# Patient Record
Sex: Male | Born: 1965 | Race: White | Hispanic: No | Marital: Married | Smoking: Never smoker
Health system: Southern US, Community
[De-identification: ages and names within clinical notes are randomized; demographics above are authoritative.]

## PROBLEM LIST (undated history)

## (undated) DIAGNOSIS — I341 Nonrheumatic mitral (valve) prolapse: Secondary | ICD-10-CM

## (undated) DIAGNOSIS — J302 Other seasonal allergic rhinitis: Secondary | ICD-10-CM

## (undated) DIAGNOSIS — E785 Hyperlipidemia, unspecified: Secondary | ICD-10-CM

## (undated) DIAGNOSIS — I10 Essential (primary) hypertension: Secondary | ICD-10-CM

## (undated) DIAGNOSIS — T7840XA Allergy, unspecified, initial encounter: Secondary | ICD-10-CM

## (undated) HISTORY — DX: Hyperlipidemia, unspecified: E78.5

## (undated) HISTORY — DX: Allergy, unspecified, initial encounter: T78.40XA

## (undated) HISTORY — DX: Essential (primary) hypertension: I10

## (undated) HISTORY — DX: Other seasonal allergic rhinitis: J30.2

## (undated) HISTORY — PX: WISDOM TOOTH EXTRACTION: SHX21

## (undated) HISTORY — PX: SEPTOPLASTY: SUR1290

## (undated) HISTORY — DX: Nonrheumatic mitral (valve) prolapse: I34.1

---

## 2005-10-07 ENCOUNTER — Ambulatory Visit: Payer: Self-pay | Admitting: Sports Medicine

## 2005-11-11 ENCOUNTER — Ambulatory Visit: Payer: Self-pay | Admitting: Sports Medicine

## 2006-07-18 ENCOUNTER — Ambulatory Visit: Payer: Self-pay | Admitting: Family Medicine

## 2006-07-18 LAB — CONVERTED CEMR LAB
ALT: 29 units/L (ref 0–40)
Albumin: 4.1 g/dL (ref 3.5–5.2)
Alkaline Phosphatase: 82 units/L (ref 39–117)
BUN: 15 mg/dL (ref 6–23)
Basophils Absolute: 0 10*3/uL (ref 0.0–0.1)
Basophils Relative: 0.4 % (ref 0.0–1.0)
CO2: 29 meq/L (ref 19–32)
Calcium: 9.6 mg/dL (ref 8.4–10.5)
Chloride: 100 meq/L (ref 96–112)
Cholesterol: 220 mg/dL (ref 0–200)
Creatinine, Ser: 0.9 mg/dL (ref 0.4–1.5)
HDL: 37.9 mg/dL — ABNORMAL LOW (ref >39.0)
Hemoglobin: 16.5 g/dL (ref 13.0–17.0)
Monocytes Relative: 8.6 % (ref 3.0–11.0)
Platelets: 368 10*3/uL (ref 150–400)
RBC: 5.18 M/uL (ref 4.22–5.81)
RDW: 11.8 % (ref 11.5–14.6)
TSH: 0.92 microintl units/mL (ref 0.35–5.50)
Total Bilirubin: 1.2 mg/dL (ref 0.3–1.2)
Total CHOL/HDL Ratio: 5.8
Total Protein: 7 g/dL (ref 6.0–8.3)
Triglycerides: 514 mg/dL (ref 0–149)
VLDL: 103 mg/dL — ABNORMAL HIGH (ref 0–40)

## 2006-10-10 ENCOUNTER — Ambulatory Visit: Payer: Self-pay | Admitting: Family Medicine

## 2006-10-10 LAB — CONVERTED CEMR LAB
Albumin: 4.3 g/dL (ref 3.5–5.2)
Alkaline Phosphatase: 53 units/L (ref 39–117)
Direct LDL: 148.8 mg/dL
HDL: 30.5 mg/dL — ABNORMAL LOW (ref >39.0)
Total Bilirubin: 1.1 mg/dL (ref 0.3–1.2)
Total CHOL/HDL Ratio: 7.2
Total Protein: 7.1 g/dL (ref 6.0–8.3)
Triglycerides: 244 mg/dL (ref 0–149)

## 2007-01-03 ENCOUNTER — Ambulatory Visit: Payer: Self-pay | Admitting: Sports Medicine

## 2007-01-03 DIAGNOSIS — M25569 Pain in unspecified knee: Secondary | ICD-10-CM

## 2007-01-03 DIAGNOSIS — M109 Gout, unspecified: Secondary | ICD-10-CM

## 2007-01-05 ENCOUNTER — Ambulatory Visit: Payer: Self-pay | Admitting: Sports Medicine

## 2007-01-16 ENCOUNTER — Ambulatory Visit: Payer: Self-pay | Admitting: Sports Medicine

## 2007-03-06 ENCOUNTER — Ambulatory Visit: Payer: Self-pay | Admitting: Family Medicine

## 2007-03-06 DIAGNOSIS — I10 Essential (primary) hypertension: Secondary | ICD-10-CM | POA: Insufficient documentation

## 2007-03-06 DIAGNOSIS — E785 Hyperlipidemia, unspecified: Secondary | ICD-10-CM | POA: Insufficient documentation

## 2007-03-08 LAB — CONVERTED CEMR LAB
ALT: 38 units/L (ref 0–53)
Alkaline Phosphatase: 53 units/L (ref 39–117)
Bilirubin, Direct: 0.1 mg/dL (ref 0.0–0.3)
Cholesterol: 184 mg/dL (ref 0–200)
HDL: 29.1 mg/dL — ABNORMAL LOW (ref >39.0)
LDL Cholesterol: 115 mg/dL — ABNORMAL HIGH (ref 0–99)
Total Bilirubin: 0.9 mg/dL (ref 0.3–1.2)
Total Protein: 7.2 g/dL (ref 6.0–8.3)

## 2008-04-25 ENCOUNTER — Telehealth: Payer: Self-pay | Admitting: *Deleted

## 2008-09-11 ENCOUNTER — Ambulatory Visit: Payer: Self-pay | Admitting: Family Medicine

## 2008-09-11 DIAGNOSIS — B351 Tinea unguium: Secondary | ICD-10-CM

## 2008-09-19 ENCOUNTER — Encounter: Payer: Self-pay | Admitting: Family Medicine

## 2008-09-19 LAB — CONVERTED CEMR LAB
AST: 28 units/L (ref 0–37)
Albumin: 4.5 g/dL (ref 3.5–5.2)
BUN: 17 mg/dL (ref 6–23)
Basophils Absolute: 0 10*3/uL (ref 0.0–0.1)
CO2: 29 meq/L (ref 19–32)
Chloride: 104 meq/L (ref 96–112)
Eosinophils Absolute: 0.1 10*3/uL (ref 0.0–0.7)
Glucose, Bld: 95 mg/dL (ref 70–99)
HCT: 44.5 % (ref 39.0–52.0)
HDL: 32.4 mg/dL — ABNORMAL LOW (ref >39.00)
Hemoglobin: 15.8 g/dL (ref 13.0–17.0)
Lymphs Abs: 2.7 10*3/uL (ref 0.7–4.0)
MCHC: 35.5 g/dL (ref 30.0–36.0)
MCV: 90.4 fL (ref 78.0–100.0)
Monocytes Absolute: 0.5 10*3/uL (ref 0.1–1.0)
Monocytes Relative: 7.8 % (ref 3.0–12.0)
Neutro Abs: 3 10*3/uL (ref 1.4–7.7)
Platelets: 353 10*3/uL (ref 150.0–400.0)
Potassium: 3.8 meq/L (ref 3.5–5.1)
RDW: 11.5 % (ref 11.5–14.6)
Sodium: 139 meq/L (ref 135–145)
TSH: 0.97 microintl units/mL (ref 0.35–5.50)
Total Bilirubin: 1.1 mg/dL (ref 0.3–1.2)
Uric Acid, Serum: 6.4 mg/dL (ref 4.0–7.8)

## 2008-09-24 ENCOUNTER — Telehealth: Payer: Self-pay | Admitting: Family Medicine

## 2008-11-11 ENCOUNTER — Ambulatory Visit: Payer: Self-pay | Admitting: Family Medicine

## 2008-11-11 DIAGNOSIS — L989 Disorder of the skin and subcutaneous tissue, unspecified: Secondary | ICD-10-CM | POA: Insufficient documentation

## 2008-11-11 DIAGNOSIS — L909 Atrophic disorder of skin, unspecified: Secondary | ICD-10-CM | POA: Insufficient documentation

## 2008-11-11 DIAGNOSIS — L919 Hypertrophic disorder of the skin, unspecified: Secondary | ICD-10-CM

## 2009-02-17 ENCOUNTER — Telehealth: Payer: Self-pay | Admitting: Family Medicine

## 2010-02-04 ENCOUNTER — Ambulatory Visit: Payer: Self-pay | Admitting: Family Medicine

## 2010-02-04 LAB — CONVERTED CEMR LAB
Bilirubin Urine: NEGATIVE
Glucose, Urine, Semiquant: NEGATIVE
Protein, U semiquant: NEGATIVE
Urobilinogen, UA: 0.2
WBC Urine, dipstick: NEGATIVE
pH: 7

## 2010-02-05 LAB — CONVERTED CEMR LAB
AST: 33 units/L (ref 0–37)
Alkaline Phosphatase: 41 units/L (ref 39–117)
BUN: 17 mg/dL (ref 6–23)
Basophils Absolute: 0 10*3/uL (ref 0.0–0.1)
Bilirubin, Direct: 0.1 mg/dL (ref 0.0–0.3)
Calcium: 9.8 mg/dL (ref 8.4–10.5)
Creatinine, Ser: 1.2 mg/dL (ref 0.4–1.5)
Direct LDL: 85.1 mg/dL
Eosinophils Absolute: 0.2 10*3/uL (ref 0.0–0.7)
GFR calc non Af Amer: 70.74 mL/min (ref >60)
Glucose, Bld: 89 mg/dL (ref 70–99)
Hemoglobin: 14.5 g/dL (ref 13.0–17.0)
Lymphocytes Relative: 43.9 % (ref 12.0–46.0)
Monocytes Relative: 6.7 % (ref 3.0–12.0)
Neutrophils Relative %: 46.3 % (ref 43.0–77.0)
Platelets: 388 10*3/uL (ref 150.0–400.0)
RDW: 12.2 % (ref 11.5–14.6)
Sodium: 141 meq/L (ref 135–145)
Total Bilirubin: 0.6 mg/dL (ref 0.3–1.2)

## 2010-02-10 ENCOUNTER — Ambulatory Visit: Payer: Self-pay | Admitting: Family Medicine

## 2010-05-18 NOTE — Assessment & Plan Note (Signed)
Summary: CPX/CJR   Vital Signs:  Patient profile:   45 year old male Height:      70 inches Weight:      221 pounds BMI:     31.82 O2 Sat:      97 % Temp:     98.5 degrees F Pulse rate:   103 / minute BP sitting:   126 / 72  (left arm) Cuff size:   large  Vitals Entered By: Pura Spice, RN (February 10, 2010 1:40 PM) CC: cpx   History of Present Illness: 45 yr old male for a cpx. He feels good and has no concerns. He is watching his diet and exercising. Since adding Fenofibrate to his meds, his TG have dropped by one half.   Preventive Screening-Counseling & Management  Alcohol-Tobacco     Smoking Status: never  Allergies (verified): No Known Drug Allergies  Past History:  Past Medical History: mitral valve prolapse Gout Hyperlipidemia Hypertension  Past Surgical History: nasal septoplasty  Family History: Reviewed history and no changes required. Family History Breast cancer 1st degree relative <50 Family History Diabetes 1st degree relative Family History High cholesterol  Social History: Reviewed history and no changes required. Occupation: Art gallery manager Married Never Smoked Alcohol use-yes Occupation:  employed Smoking Status:  never  Review of Systems  The patient denies anorexia, fever, weight loss, weight gain, vision loss, decreased hearing, hoarseness, chest pain, syncope, dyspnea on exertion, peripheral edema, prolonged cough, headaches, hemoptysis, abdominal pain, melena, hematochezia, severe indigestion/heartburn, hematuria, incontinence, genital sores, muscle weakness, suspicious skin lesions, transient blindness, difficulty walking, depression, unusual weight change, abnormal bleeding, enlarged lymph nodes, angioedema, breast masses, and testicular masses.         Flu Vaccine Consent Questions     Do you have a history of severe allergic reactions to this vaccine? no    Any prior history of allergic reactions to egg and/or gelatin? no    Do you  have a sensitivity to the preservative Thimersol? no    Do you have a past history of Guillan-Barre Syndrome? no    Do you currently have an acute febrile illness? no    Have you ever had a severe reaction to latex? no    Vaccine information given and explained to patient? yes    Are you currently pregnant? no    Lot Number:[id number]   Exp Date:10/16/2010   Site Given  Left Deltoid IM Pura Spice, RN  February 10, 2010 1:41 PM   Physical Exam  General:  overweight-appearing.   Head:  Normocephalic and atraumatic without obvious abnormalities. No apparent alopecia or balding. Eyes:  No corneal or conjunctival inflammation noted. EOMI. Perrla. Funduscopic exam benign, without hemorrhages, exudates or papilledema. Vision grossly normal. Ears:  External ear exam shows no significant lesions or deformities.  Otoscopic examination reveals clear canals, tympanic membranes are intact bilaterally without bulging, retraction, inflammation or discharge. Hearing is grossly normal bilaterally. Nose:  External nasal examination shows no deformity or inflammation. Nasal mucosa are pink and moist without lesions or exudates. Mouth:  Oral mucosa and oropharynx without lesions or exudates.  Teeth in good repair. Neck:  No deformities, masses, or tenderness noted. Chest Wall:  No deformities, masses, tenderness or gynecomastia noted. Lungs:  Normal respiratory effort, chest expands symmetrically. Lungs are clear to auscultation, no crackles or wheezes. Heart:  Normal rate and regular rhythm. S1 and S2 normal without gallop, murmur, click, rub or other extra sounds. Abdomen:  Bowel sounds positive,abdomen  soft and non-tender without masses, organomegaly or hernias noted. Genitalia:  Testes bilaterally descended without nodularity, tenderness or masses. No scrotal masses or lesions. No penis lesions or urethral discharge. Msk:  No deformity or scoliosis noted of thoracic or lumbar spine.   Pulses:  R and L  carotid,radial,femoral,dorsalis pedis and posterior tibial pulses are full and equal bilaterally Extremities:  No clubbing, cyanosis, edema, or deformity noted with normal full range of motion of all joints.   Neurologic:  No cranial nerve deficits noted. Station and gait are normal. Plantar reflexes are down-going bilaterally. DTRs are symmetrical throughout. Sensory, motor and coordinative functions appear intact. Skin:  Intact without suspicious lesions or rashes Cervical Nodes:  No lymphadenopathy noted Axillary Nodes:  No palpable lymphadenopathy Inguinal Nodes:  No significant adenopathy Psych:  Cognition and judgment appear intact. Alert and cooperative with normal attention span and concentration. No apparent delusions, illusions, hallucinations   Impression & Recommendations:  Problem # 1:  HEALTH MAINTENANCE EXAM (ICD-V70.0)  Complete Medication List: 1)  Allopurinol 100 Mg Tabs (Allopurinol) .Marland Kitchen.. 1 by mouth once daily 2)  Cozaar 100 Mg Tabs (Losartan potassium) .... Take 1 tablet by mouth once a day 3)  Crestor 20 Mg Tabs (Rosuvastatin calcium) .... Once daily 4)  Tricor 145 Mg Tabs (Fenofibrate) .... Take 1 tablet by mouth every night 5)  Fish Oil 1000 Mg Caps (Omega-3 fatty acids) .... 2 once daily  Other Orders: Admin 1st Vaccine (16109) Flu Vaccine 69yrs + (60454) Tdap => 73yrs IM (09811) Admin of Any Addtl Vaccine (91478)  Patient Instructions: 1)  Please schedule a follow-up appointment in 6 months .  Prescriptions: TRICOR 145 MG TABS (FENOFIBRATE) Take 1 tablet by mouth every night  #90 x 3   Entered and Authorized by:   Nelwyn Salisbury MD   Signed by:   Nelwyn Salisbury MD on 02/10/2010   Method used:   Print then Give to Patient   RxID:   [id number] CRESTOR 20 MG TABS (ROSUVASTATIN CALCIUM) once daily  #90 x 3   Entered and Authorized by:   Nelwyn Salisbury MD   Signed by:   Nelwyn Salisbury MD on 02/10/2010   Method used:   Print then Give to Patient   RxID:    [id number] COZAAR 100 MG TABS (LOSARTAN POTASSIUM) Take 1 tablet by mouth once a day  #90 x 3   Entered and Authorized by:   Nelwyn Salisbury MD   Signed by:   Nelwyn Salisbury MD on 02/10/2010   Method used:   Print then Give to Patient   RxID:   [id number] ALLOPURINOL 100 MG TABS (ALLOPURINOL) 1 by mouth once daily  #90 x 3   Entered and Authorized by:   Nelwyn Salisbury MD   Signed by:   Nelwyn Salisbury MD on 02/10/2010   Method used:   Print then Give to Patient   RxID:   [id number]    Orders Added: 1)  Admin 1st Vaccine [90471] 2)  Flu Vaccine 86yrs + [41660] 3)  Tdap => 82yrs IM [63016] 4)  Admin of Any Addtl Vaccine [90472] 5)  Est. Patient 40-64 years [01093]   Immunizations Administered:  Tetanus Vaccine:    Vaccine Type: Tdap    Site: right deltoid    Mfr: GlaxoSmithKline    Dose: 0.5 ml    Route: IM    Given by: Pura Spice, RN    Exp. Date: 02/05/2012  Lot #: [id number]    VIS given: 03/05/08 version given February 10, 2010.   Immunizations Administered:  Tetanus Vaccine:    Vaccine Type: Tdap    Site: right deltoid    Mfr: GlaxoSmithKline    Dose: 0.5 ml    Route: IM    Given by: Pura Spice, RN    Exp. Date: 02/05/2012    Lot #: [id number]    VIS given: 03/05/08 version given February 10, 2010.

## 2010-06-15 ENCOUNTER — Other Ambulatory Visit: Payer: Self-pay | Admitting: Family Medicine

## 2010-09-03 NOTE — Assessment & Plan Note (Signed)
[facility] OFFICE NOTE   NAME:Austin Hughes, Austin Hughes                         MRN:          [id number]  DATE:07/18/2006                            DOB:          18-Apr-1966    This is a 45 year old gentleman here to establish care with the  practice, and who is requesting medication refills.  He has a long  history of hypertension, and high cholesterol, high triglycerides.  Unfortunately he ran out of all of his medications over a year ago and  really has not seen a doctor in that time.  In general he feels fine.  He does mention an occasional gout flare-up.  This used to be much more  common, but now occurs only about once a year.   PAST MEDICAL HISTORY:  He had been seeing a doctor in [place] up  until about a year and a half ago before moving to [place].  He was  diagnosed with high cholesterol and hypertension about 5 years ago and  has been on medications as well as a strict diet ever since.  His only  surgical history is repair of a deviated septum.  He was diagnosed with  mild mitral valve prolapse about 7 years ago.  Cardiologist at that time  worked him up with a Holter monitor and an echocardiogram.  Apparently  he is asymptomatic from it.   ALLERGIES:  NONE.   CURRENT MEDICATIONS:  None (however he had previously been on Cozaar 100  mg per day, Advicor 500/20 once a day, and Tricor 145 mg per day).   HABITS:  He does not use tobacco, he drinks some alcohol.   SOCIAL HISTORY:  He is married with 2 children.  He is an Art gallery manager.   FAMILY HISTORY:  Remarkable for breast cancer, high cholesterol, and  diabetes.   OBJECTIVE:  Height 5 foot 11 inches, weight 221, blood pressure 146/82,  pulse 76 and regular.  GENERAL:  He appears to be fairly healthy.  NECK:  Supple without lymphadenopathy or masses.  LUNGS:  Clear.  CARDIAC:  Rate and rhythm regular without gallops, murmurs, or rubs.  Distal pulses  are full.   ASSESSMENT/PLAN:  1. Hyperlipidemia.  Will restart Tricor 145 mg per day and instead of      Advicor will begin Crestor 10 mg once daily.  He is fasting today      so I will check a fasting lipid panel while he is here.  2. Hypertension.  Will restart Cozaar 100 mg once daily.  3. History of gout.  Will check a uric acid level today.    Tera Mater. Clent Ridges, MD  Electronically Signed   SAF/MedQ  DD: 07/18/2006  DT: 07/18/2006  Job #: [id number]

## 2010-09-14 ENCOUNTER — Other Ambulatory Visit: Payer: Self-pay | Admitting: Family Medicine

## 2010-09-15 ENCOUNTER — Encounter: Payer: Self-pay | Admitting: Family Medicine

## 2010-09-15 ENCOUNTER — Ambulatory Visit (INDEPENDENT_AMBULATORY_CARE_PROVIDER_SITE_OTHER): Payer: 59 | Admitting: Family Medicine

## 2010-09-15 DIAGNOSIS — L600 Ingrowing nail: Secondary | ICD-10-CM

## 2010-09-15 DIAGNOSIS — I10 Essential (primary) hypertension: Secondary | ICD-10-CM

## 2010-09-15 DIAGNOSIS — M109 Gout, unspecified: Secondary | ICD-10-CM

## 2010-09-15 DIAGNOSIS — E785 Hyperlipidemia, unspecified: Secondary | ICD-10-CM

## 2010-09-15 MED ORDER — ASPIRIN EC 81 MG PO TBEC: 81.0000 mg | DELAYED_RELEASE_TABLET | Freq: Every day | ORAL | Status: AC

## 2010-09-15 MED ORDER — ALLOPURINOL 100 MG PO TABS: 100.0000 mg | ORAL_TABLET | Freq: Every day | ORAL | Status: DC

## 2010-09-15 NOTE — Progress Notes (Signed)
  Subjective:    Patient ID: Austin Hughes, male    DOB: April 24, 1965, 45 y.o.   MRN: [id number]  HPI Here for refills and to check on a painful right great toe. This has been bothering him for about 2 months. His BP is stable and he has been doing well. No gout flares for several years now.    Review of Systems  Constitutional: Negative.   Respiratory: Negative.   Cardiovascular: Negative.        Objective:   Physical Exam  Constitutional: He appears well-developed and well-nourished.  Cardiovascular: Normal rate, regular rhythm, normal heart sounds and intact distal pulses.   Pulmonary/Chest: Effort normal and breath sounds normal.  Skin:       The right great toenail is ingrown both medially and laterally. The toe is tender but not red. The nail is cut extremely short          Assessment & Plan:  Advised him to keep his toenails longer to avoid getting ingrown. Try hot soaks with Epsom salts. Set up fasting labs soon.

## 2010-09-21 ENCOUNTER — Other Ambulatory Visit: Payer: Self-pay | Admitting: Family Medicine

## 2010-09-21 ENCOUNTER — Other Ambulatory Visit (INDEPENDENT_AMBULATORY_CARE_PROVIDER_SITE_OTHER): Payer: 59

## 2010-09-21 DIAGNOSIS — E785 Hyperlipidemia, unspecified: Secondary | ICD-10-CM

## 2010-09-21 LAB — LIPID PANEL
Cholesterol: 165 mg/dL (ref 0–200)
HDL: 37.8 mg/dL — ABNORMAL LOW (ref >39.00)
Total CHOL/HDL Ratio: 4
VLDL: 47.4 mg/dL — ABNORMAL HIGH (ref 0.0–40.0)

## 2010-09-21 LAB — HEPATIC FUNCTION PANEL
Albumin: 4.3 g/dL (ref 3.5–5.2)
Alkaline Phosphatase: 49 U/L (ref 39–117)
Total Bilirubin: 0.5 mg/dL (ref 0.3–1.2)

## 2010-09-27 ENCOUNTER — Encounter: Payer: Self-pay | Admitting: Family Medicine

## 2010-09-27 ENCOUNTER — Telehealth: Payer: Self-pay | Admitting: *Deleted

## 2010-09-27 NOTE — Telephone Encounter (Signed)
Pt wanted lab results. Pt aware of results.

## 2011-02-08 ENCOUNTER — Other Ambulatory Visit: Payer: Self-pay | Admitting: Family Medicine

## 2011-02-08 NOTE — Telephone Encounter (Signed)
Scripts sent e-scribe 

## 2011-07-04 ENCOUNTER — Other Ambulatory Visit: Payer: Self-pay | Admitting: Family Medicine

## 2011-10-13 ENCOUNTER — Other Ambulatory Visit: Payer: Self-pay | Admitting: Family Medicine

## 2011-10-13 NOTE — Telephone Encounter (Signed)
Can we refill this? 

## 2011-10-14 NOTE — Telephone Encounter (Signed)
I called and left a voice message. I need the name and location of a local pharmacy. The script was only approved for a 30 day supply and pt does not have a local pharmacy listed. He needs to call back with this information.

## 2011-10-14 NOTE — Telephone Encounter (Signed)
Call in #30 only. He needs an OV  

## 2011-10-17 ENCOUNTER — Telehealth: Payer: Self-pay | Admitting: Family Medicine

## 2011-10-17 MED ORDER — ALLOPURINOL 100 MG PO TABS: 100.0000 mg | ORAL_TABLET | Freq: Every day | ORAL | Status: DC

## 2011-10-17 NOTE — Telephone Encounter (Signed)
I sent script for Allopurinol to [facility] for a 30 day supply. Pt needs office visit for future refills.

## 2011-10-17 NOTE — Telephone Encounter (Signed)
I did get pt's local pharmacy and sent script e-scribe.

## 2011-12-13 ENCOUNTER — Other Ambulatory Visit (INDEPENDENT_AMBULATORY_CARE_PROVIDER_SITE_OTHER): Payer: 59

## 2011-12-13 DIAGNOSIS — Z0000 Encounter for general adult medical examination without abnormal findings: Secondary | ICD-10-CM

## 2011-12-13 LAB — CBC WITH DIFFERENTIAL/PLATELET
Basophils Absolute: 0 10*3/uL (ref 0.0–0.1)
Eosinophils Absolute: 0.2 10*3/uL (ref 0.0–0.7)
Lymphocytes Relative: 45.1 % (ref 12.0–46.0)
MCHC: 33.8 g/dL (ref 30.0–36.0)
Monocytes Relative: 8.3 % (ref 3.0–12.0)
Platelets: 342 10*3/uL (ref 150.0–400.0)
RDW: 12.3 % (ref 11.5–14.6)

## 2011-12-13 LAB — POCT URINALYSIS DIPSTICK
Bilirubin, UA: NEGATIVE
Blood, UA: NEGATIVE
Glucose, UA: NEGATIVE
Nitrite, UA: NEGATIVE
Spec Grav, UA: 1.02
Urobilinogen, UA: 0.2

## 2011-12-13 LAB — BASIC METABOLIC PANEL
BUN: 24 mg/dL — ABNORMAL HIGH (ref 6–23)
CO2: 24 mEq/L (ref 19–32)
Calcium: 9.3 mg/dL (ref 8.4–10.5)
Creatinine, Ser: 1.3 mg/dL (ref 0.4–1.5)
GFR: 64.48 mL/min (ref >60.00)
Glucose, Bld: 104 mg/dL — ABNORMAL HIGH (ref 70–99)

## 2011-12-13 LAB — LIPID PANEL
Cholesterol: 152 mg/dL (ref 0–200)
HDL: 36 mg/dL — ABNORMAL LOW (ref >39.00)
Total CHOL/HDL Ratio: 4
Triglycerides: 174 mg/dL — ABNORMAL HIGH (ref 0.0–149.0)

## 2011-12-13 LAB — HEPATIC FUNCTION PANEL
AST: 30 U/L (ref 0–37)
Alkaline Phosphatase: 48 U/L (ref 39–117)
Total Bilirubin: 0.6 mg/dL (ref 0.3–1.2)

## 2011-12-13 LAB — TSH: TSH: 1.34 u[IU]/mL (ref 0.35–5.50)

## 2011-12-20 ENCOUNTER — Ambulatory Visit (INDEPENDENT_AMBULATORY_CARE_PROVIDER_SITE_OTHER): Payer: 59 | Admitting: Family Medicine

## 2011-12-20 ENCOUNTER — Encounter: Payer: Self-pay | Admitting: Family Medicine

## 2011-12-20 VITALS — BP 120/80 | HR 68 | Temp 98.6°F | Ht 72.0 in | Wt 221.0 lb

## 2011-12-20 DIAGNOSIS — Z0000 Encounter for general adult medical examination without abnormal findings: Secondary | ICD-10-CM

## 2011-12-20 MED ORDER — ROSUVASTATIN CALCIUM 20 MG PO TABS: 20.0000 mg | ORAL_TABLET | Freq: Every day | ORAL | Status: DC

## 2011-12-20 MED ORDER — LOSARTAN POTASSIUM 100 MG PO TABS: 100.0000 mg | ORAL_TABLET | Freq: Every day | ORAL | Status: DC

## 2011-12-20 MED ORDER — FENOFIBRATE 145 MG PO TABS: 145.0000 mg | ORAL_TABLET | Freq: Every day | ORAL | Status: DC

## 2011-12-20 MED ORDER — ALLOPURINOL 100 MG PO TABS: 100.0000 mg | ORAL_TABLET | Freq: Every day | ORAL | Status: DC

## 2011-12-20 NOTE — Progress Notes (Signed)
  Subjective:    Patient ID: Austin Hughes, male    DOB: 1965/12/23, 46 y.o.   MRN: [id number]  HPI 46 yr old male for a cpx. He feels fine and has no concerns.    Review of Systems  Constitutional: Negative.   HENT: Negative.   Eyes: Negative.   Respiratory: Negative.   Cardiovascular: Negative.   Gastrointestinal: Negative.   Genitourinary: Negative.   Musculoskeletal: Negative.   Skin: Negative.   Neurological: Negative.   Hematological: Negative.   Psychiatric/Behavioral: Negative.        Objective:   Physical Exam  Constitutional: He is oriented to person, place, and time. He appears well-developed and well-nourished. No distress.  HENT:  Head: Normocephalic and atraumatic.  Right Ear: External ear normal.  Left Ear: External ear normal.  Nose: Nose normal.  Mouth/Throat: Oropharynx is clear and moist. No oropharyngeal exudate.  Eyes: Conjunctivae and EOM are normal. Pupils are equal, round, and reactive to light. Right eye exhibits no discharge. Left eye exhibits no discharge. No scleral icterus.  Neck: Neck supple. No JVD present. No tracheal deviation present. No thyromegaly present.  Cardiovascular: Normal rate, regular rhythm, normal heart sounds and intact distal pulses.  Exam reveals no gallop and no friction rub.   No murmur heard. Pulmonary/Chest: Effort normal and breath sounds normal. No respiratory distress. He has no wheezes. He has no rales. He exhibits no tenderness.  Abdominal: Soft. Bowel sounds are normal. He exhibits no distension and no mass. There is no tenderness. There is no rebound and no guarding.  Genitourinary: Rectum normal, prostate normal and penis normal. Guaiac negative stool. No penile tenderness.  Musculoskeletal: Normal range of motion. He exhibits no edema and no tenderness.  Lymphadenopathy:    He has no cervical adenopathy.  Neurological: He is alert and oriented to person, place, and time. He has normal reflexes. No cranial nerve  deficit. He exhibits normal muscle tone. Coordination normal.  Skin: Skin is warm and dry. No rash noted. He is not diaphoretic. No erythema. No pallor.  Psychiatric: He has a normal mood and affect. His behavior is normal. Judgment and thought content normal.          Assessment & Plan:  Well exam.

## 2012-06-05 ENCOUNTER — Ambulatory Visit (INDEPENDENT_AMBULATORY_CARE_PROVIDER_SITE_OTHER): Payer: 59 | Admitting: Family Medicine

## 2012-06-05 ENCOUNTER — Encounter: Payer: Self-pay | Admitting: Family Medicine

## 2012-06-05 ENCOUNTER — Telehealth: Payer: Self-pay | Admitting: Family Medicine

## 2012-06-05 VITALS — BP 126/74 | HR 90 | Temp 99.0°F | Wt 226.0 lb

## 2012-06-05 DIAGNOSIS — N390 Urinary tract infection, site not specified: Secondary | ICD-10-CM

## 2012-06-05 LAB — POCT URINALYSIS DIPSTICK
Bilirubin, UA: NEGATIVE
Glucose, UA: NEGATIVE
Ketones, UA: NEGATIVE
Nitrite, UA: NEGATIVE
Protein, UA: NEGATIVE
Spec Grav, UA: <=1.005
Urobilinogen, UA: 0.2
pH, UA: 7.5

## 2012-06-05 MED ORDER — CIPROFLOXACIN HCL 500 MG PO TABS: 500.0000 mg | ORAL_TABLET | Freq: Two times a day (BID) | ORAL | Status: DC

## 2012-06-05 NOTE — Progress Notes (Signed)
  Subjective:    Patient ID: Austin Hughes, male    DOB: [birth year], 47 y.o.   MRN: [id number]  HPI Here for 2 days of intermittent brownish red coloration to the urine. He had some mild lower back pain yesterday but not today. No burning or urgency or fever. No recent trauma.    Review of Systems  Constitutional: Negative.   Gastrointestinal: Negative.   Genitourinary: Positive for hematuria. Negative for dysuria, frequency, flank pain, difficulty urinating and testicular pain.       Objective:   Physical Exam  Constitutional: He appears well-developed and well-nourished.  Abdominal: Soft. Bowel sounds are normal. He exhibits no distension and no mass. There is no tenderness. There is no rebound and no guarding.          Assessment & Plan:  Culture the urine. Start on Cipro. Drink plenty of water

## 2012-06-05 NOTE — Telephone Encounter (Signed)
Patient Information:  Caller Name: Jeptha  Phone: [phone]  Patient: Austin Hughes, Austin Hughes  Gender: Male  DOB: 10-03-1965  Age: 47 Years  PCP: Gershon Crane [facility])  Office Follow Up:  Does the office need to follow up with this patient?: No  Instructions For The Office: N/A  RN Note:  Per [facility] See Provider w/i 72 hrs d/t "Change in color of urine not related to consuming food or dyes and not previously evaluated' per [facility] Urine Protocol  Symptoms  Reason For Call & Symptoms: Blood in urine.  Afebrile.  Had mild low back discomfort 06/04/12. No pain today.  No dysuria.  Reviewed Health History In EMR: Yes  Reviewed Medications In EMR: Yes  Reviewed Allergies In EMR: Yes  Reviewed Surgeries / Procedures: Yes  Date of Onset of Symptoms: 06/04/2012  Guideline(s) Used:  No Protocol Available - Sick Adult  Disposition Per Guideline:   See Today or Tomorrow in Office  Reason For Disposition Reached:   Nursing judgment  Advice Given:  Call Back If:  New symptoms develop  You become worse.  Appointment Scheduled:  06/05/2012 16:00:00 Appointment Scheduled Provider:  Gershon Crane [facility])

## 2012-06-07 LAB — URINE CULTURE: Colony Count: NO GROWTH

## 2012-06-12 NOTE — Progress Notes (Signed)
Quick Note:    Results released to mychart.

## 2013-01-07 ENCOUNTER — Other Ambulatory Visit: Payer: Self-pay | Admitting: Family Medicine

## 2013-01-30 ENCOUNTER — Other Ambulatory Visit (INDEPENDENT_AMBULATORY_CARE_PROVIDER_SITE_OTHER): Payer: 59

## 2013-01-30 DIAGNOSIS — Z0000 Encounter for general adult medical examination without abnormal findings: Secondary | ICD-10-CM

## 2013-01-30 LAB — CBC WITH DIFFERENTIAL/PLATELET
Basophils Absolute: 0 10*3/uL (ref 0.0–0.1)
Eosinophils Absolute: 0.2 10*3/uL (ref 0.0–0.7)
HCT: 43.1 % (ref 39.0–52.0)
Lymphs Abs: 2.7 10*3/uL (ref 0.7–4.0)
MCHC: 34.4 g/dL (ref 30.0–36.0)
Monocytes Absolute: 0.5 10*3/uL (ref 0.1–1.0)
Monocytes Relative: 8 % (ref 3.0–12.0)
Platelets: 423 10*3/uL — ABNORMAL HIGH (ref 150.0–400.0)
RDW: 12 % (ref 11.5–14.6)

## 2013-01-30 LAB — LDL CHOLESTEROL, DIRECT: Direct LDL: 82.2 mg/dL

## 2013-01-30 LAB — HEPATIC FUNCTION PANEL
Albumin: 4.5 g/dL (ref 3.5–5.2)
Total Bilirubin: 0.8 mg/dL (ref 0.3–1.2)
Total Protein: 7.4 g/dL (ref 6.0–8.3)

## 2013-01-30 LAB — POCT URINALYSIS DIPSTICK
Glucose, UA: NEGATIVE
Ketones, UA: NEGATIVE
Leukocytes, UA: NEGATIVE
Nitrite, UA: NEGATIVE
Protein, UA: NEGATIVE
Urobilinogen, UA: 0.2
pH, UA: 7

## 2013-01-30 LAB — TSH: TSH: 1.15 u[IU]/mL (ref 0.35–5.50)

## 2013-01-30 LAB — BASIC METABOLIC PANEL
BUN: 18 mg/dL (ref 6–23)
CO2: 28 mEq/L (ref 19–32)
GFR: 69.12 mL/min (ref >60.00)
Glucose, Bld: 110 mg/dL — ABNORMAL HIGH (ref 70–99)
Potassium: 4.3 mEq/L (ref 3.5–5.1)

## 2013-01-30 LAB — LIPID PANEL
HDL: 32.7 mg/dL — ABNORMAL LOW (ref >39.00)
Total CHOL/HDL Ratio: 4

## 2013-02-05 NOTE — Progress Notes (Signed)
Quick Note:  I released results in my chart. ______ 

## 2013-02-06 ENCOUNTER — Ambulatory Visit (INDEPENDENT_AMBULATORY_CARE_PROVIDER_SITE_OTHER): Payer: 59 | Admitting: Family Medicine

## 2013-02-06 ENCOUNTER — Encounter: Payer: Self-pay | Admitting: Family Medicine

## 2013-02-06 VITALS — BP 120/62 | HR 109 | Temp 98.8°F | Ht 70.75 in | Wt 222.0 lb

## 2013-02-06 DIAGNOSIS — Z23 Encounter for immunization: Secondary | ICD-10-CM

## 2013-02-06 DIAGNOSIS — Z0000 Encounter for general adult medical examination without abnormal findings: Secondary | ICD-10-CM

## 2013-02-06 DIAGNOSIS — N2889 Other specified disorders of kidney and ureter: Secondary | ICD-10-CM

## 2013-02-06 DIAGNOSIS — N200 Calculus of kidney: Secondary | ICD-10-CM

## 2013-02-06 NOTE — Progress Notes (Signed)
  Subjective:    Patient ID: Austin Hughes, male    DOB: 10-Dec-1965, 47 y.o.   MRN: [id number]  HPI 47 yr old male for a cpx. He feels fine. He was here in February with a UTI. This resolved with treatment but he thinks he passed a small kidney stone several weeks after that. He had a mild pain in the right flank that moved to the lower abdomen over 2 or 3 hours and then as he was urinating he felt a tingle in the urethra and passed a tiny object into the toilet. He has never had this before or since.    Review of Systems  Constitutional: Negative.   HENT: Negative.   Eyes: Negative.   Respiratory: Negative.   Cardiovascular: Negative.   Gastrointestinal: Negative.   Genitourinary: Negative.   Musculoskeletal: Negative.   Skin: Negative.   Neurological: Negative.   Psychiatric/Behavioral: Negative.        Objective:   Physical Exam  Constitutional: He is oriented to person, place, and time. He appears well-developed and well-nourished. No distress.  HENT:  Head: Normocephalic and atraumatic.  Right Ear: External ear normal.  Left Ear: External ear normal.  Nose: Nose normal.  Mouth/Throat: Oropharynx is clear and moist. No oropharyngeal exudate.  Eyes: Conjunctivae and EOM are normal. Pupils are equal, round, and reactive to light. Right eye exhibits no discharge. Left eye exhibits no discharge. No scleral icterus.  Neck: Neck supple. No JVD present. No tracheal deviation present. No thyromegaly present.  Cardiovascular: Normal rate, regular rhythm, normal heart sounds and intact distal pulses.  Exam reveals no gallop and no friction rub.   No murmur heard. Pulmonary/Chest: Effort normal and breath sounds normal. No respiratory distress. He has no wheezes. He has no rales. He exhibits no tenderness.  Abdominal: Soft. Bowel sounds are normal. He exhibits no distension and no mass. There is no tenderness. There is no rebound and no guarding.  Genitourinary: Rectum normal, prostate  normal and penis normal. Guaiac negative stool. No penile tenderness.  Musculoskeletal: Normal range of motion. He exhibits no edema and no tenderness.  Lymphadenopathy:    He has no cervical adenopathy.  Neurological: He is alert and oriented to person, place, and time. He has normal reflexes. No cranial nerve deficit. He exhibits normal muscle tone. Coordination normal.  Skin: Skin is warm and dry. No rash noted. He is not diaphoretic. No erythema. No pallor.  Psychiatric: He has a normal mood and affect. His behavior is normal. Judgment and thought content normal.          Assessment & Plan:  Well exam. It does sound like he passed a kidney stone so we will set up a renal US to see there are any more present.

## 2013-02-25 ENCOUNTER — Other Ambulatory Visit: Payer: 59

## 2013-03-05 ENCOUNTER — Ambulatory Visit
Admission: RE | Admit: 2013-03-05 | Discharge: 2013-03-05 | Disposition: A | Discharge status: Home / Self Care | Payer: 59 | Source: Ambulatory Visit | Attending: Family Medicine | Admitting: Family Medicine

## 2013-03-05 DIAGNOSIS — N200 Calculus of kidney: Secondary | ICD-10-CM

## 2013-03-05 NOTE — Addendum Note (Signed)
Addended by: Gershon Crane A on: 03/05/2013 10:54 AM   Modules accepted: Orders

## 2013-03-12 ENCOUNTER — Ambulatory Visit
Admission: RE | Admit: 2013-03-12 | Discharge: 2013-03-12 | Disposition: A | Discharge status: Home / Self Care | Payer: 59 | Source: Ambulatory Visit | Attending: Family Medicine | Admitting: Family Medicine

## 2013-03-12 DIAGNOSIS — N2889 Other specified disorders of kidney and ureter: Secondary | ICD-10-CM

## 2013-03-12 MED ORDER — GADOBENATE DIMEGLUMINE 529 MG/ML IV SOLN
20.0000 mL | Freq: Once | INTRAVENOUS | Status: AC | PRN
  Administered 2013-03-12: 20 mL via INTRAVENOUS

## 2013-06-19 ENCOUNTER — Other Ambulatory Visit: Payer: Self-pay | Admitting: Family Medicine

## 2014-04-24 ENCOUNTER — Other Ambulatory Visit (INDEPENDENT_AMBULATORY_CARE_PROVIDER_SITE_OTHER): Payer: 59

## 2014-04-24 DIAGNOSIS — Z0000 Encounter for general adult medical examination without abnormal findings: Secondary | ICD-10-CM

## 2014-04-24 LAB — LIPID PANEL
CHOLESTEROL: 133 mg/dL (ref 0–200)
HDL: 32 mg/dL — ABNORMAL LOW (ref >39.00)
LDL CALC: 70 mg/dL (ref 0–99)
NonHDL: 101
TRIGLYCERIDES: 157 mg/dL — AB (ref 0.0–149.0)
Total CHOL/HDL Ratio: 4
VLDL: 31.4 mg/dL (ref 0.0–40.0)

## 2014-04-24 LAB — HEPATIC FUNCTION PANEL
ALK PHOS: 44 U/L (ref 39–117)
ALT: 31 U/L (ref 0–53)
AST: 36 U/L (ref 0–37)
Albumin: 4.6 g/dL (ref 3.5–5.2)
BILIRUBIN DIRECT: 0 mg/dL (ref 0.0–0.3)
BILIRUBIN TOTAL: 0.7 mg/dL (ref 0.2–1.2)
Total Protein: 7.3 g/dL (ref 6.0–8.3)

## 2014-04-24 LAB — POCT URINALYSIS DIPSTICK
Bilirubin, UA: NEGATIVE
Blood, UA: NEGATIVE
Glucose, UA: NEGATIVE
KETONES UA: NEGATIVE
Leukocytes, UA: NEGATIVE
NITRITE UA: NEGATIVE
Protein, UA: NEGATIVE
Spec Grav, UA: 1.02
Urobilinogen, UA: 0.2
pH, UA: 7

## 2014-04-24 LAB — CBC WITH DIFFERENTIAL/PLATELET
BASOS PCT: 0.6 % (ref 0.0–3.0)
Basophils Absolute: 0 10*3/uL (ref 0.0–0.1)
EOS PCT: 3.4 % (ref 0.0–5.0)
Eosinophils Absolute: 0.2 10*3/uL (ref 0.0–0.7)
HCT: 45.9 % (ref 39.0–52.0)
HEMOGLOBIN: 15.1 g/dL (ref 13.0–17.0)
LYMPHS ABS: 2.9 10*3/uL (ref 0.7–4.0)
Lymphocytes Relative: 45 % (ref 12.0–46.0)
MCHC: 32.9 g/dL (ref 30.0–36.0)
MCV: 92.7 fl (ref 78.0–100.0)
MONOS PCT: 7.7 % (ref 3.0–12.0)
Monocytes Absolute: 0.5 10*3/uL (ref 0.1–1.0)
Neutro Abs: 2.8 10*3/uL (ref 1.4–7.7)
Neutrophils Relative %: 43.3 % (ref 43.0–77.0)
PLATELETS: 400 10*3/uL (ref 150.0–400.0)
RBC: 4.95 Mil/uL (ref 4.22–5.81)
RDW: 12.7 % (ref 11.5–15.5)
WBC: 6.6 10*3/uL (ref 4.0–10.5)

## 2014-04-24 LAB — BASIC METABOLIC PANEL
BUN: 20 mg/dL (ref 6–23)
CALCIUM: 9.6 mg/dL (ref 8.4–10.5)
CHLORIDE: 101 meq/L (ref 96–112)
CO2: 25 meq/L (ref 19–32)
CREATININE: 1.3 mg/dL (ref 0.4–1.5)
GFR: 63.82 mL/min (ref >60.00)
GLUCOSE: 106 mg/dL — AB (ref 70–99)
POTASSIUM: 4.2 meq/L (ref 3.5–5.1)
SODIUM: 136 meq/L (ref 135–145)

## 2014-04-24 LAB — TSH: TSH: 1.2 u[IU]/mL (ref 0.35–4.50)

## 2014-04-30 ENCOUNTER — Encounter: Payer: 59 | Admitting: Family Medicine

## 2014-05-12 ENCOUNTER — Encounter: Payer: Self-pay | Admitting: Family Medicine

## 2014-05-12 ENCOUNTER — Ambulatory Visit (INDEPENDENT_AMBULATORY_CARE_PROVIDER_SITE_OTHER): Payer: 59 | Admitting: Family Medicine

## 2014-05-12 VITALS — BP 124/70 | HR 69 | Temp 98.4°F | Ht 70.75 in | Wt 215.0 lb

## 2014-05-12 DIAGNOSIS — Z0000 Encounter for general adult medical examination without abnormal findings: Secondary | ICD-10-CM

## 2014-05-12 NOTE — Progress Notes (Signed)
   Subjective:    Patient ID: Austin Hughes, male    DOB: 10-18-1965, 49 y.o.   MRN: [id number]  HPI 49 yr old male for a cpx. He feels well. He is exercising and watching his diet.    Review of Systems  Constitutional: Negative.   HENT: Negative.   Eyes: Negative.   Respiratory: Negative.   Cardiovascular: Negative.   Gastrointestinal: Negative.   Genitourinary: Negative.   Musculoskeletal: Negative.   Skin: Negative.   Neurological: Negative.   Psychiatric/Behavioral: Negative.        Objective:   Physical Exam  Constitutional: He is oriented to person, place, and time. He appears well-developed and well-nourished. No distress.  HENT:  Head: Normocephalic and atraumatic.  Right Ear: External ear normal.  Left Ear: External ear normal.  Nose: Nose normal.  Mouth/Throat: Oropharynx is clear and moist. No oropharyngeal exudate.  Eyes: Conjunctivae and EOM are normal. Pupils are equal, round, and reactive to light. Right eye exhibits no discharge. Left eye exhibits no discharge. No scleral icterus.  Neck: Neck supple. No JVD present. No tracheal deviation present. No thyromegaly present.  Cardiovascular: Normal rate, regular rhythm, normal heart sounds and intact distal pulses.  Exam reveals no gallop and no friction rub.   No murmur heard. Pulmonary/Chest: Effort normal and breath sounds normal. No respiratory distress. He has no wheezes. He has no rales. He exhibits no tenderness.  Abdominal: Soft. Bowel sounds are normal. He exhibits no distension and no mass. There is no tenderness. There is no rebound and no guarding.  Genitourinary: Rectum normal, prostate normal and penis normal. Guaiac negative stool. No penile tenderness.  Musculoskeletal: Normal range of motion. He exhibits no edema or tenderness.  Lymphadenopathy:    He has no cervical adenopathy.  Neurological: He is alert and oriented to person, place, and time. He has normal reflexes. No cranial nerve deficit. He  exhibits normal muscle tone. Coordination normal.  Skin: Skin is warm and dry. No rash noted. He is not diaphoretic. No erythema. No pallor.  Psychiatric: He has a normal mood and affect. His behavior is normal. Judgment and thought content normal.          Assessment & Plan:  Well exam.

## 2014-05-12 NOTE — Progress Notes (Signed)
Pre visit review using our clinic review tool, if applicable. No additional management support is needed unless otherwise documented below in the visit note. 

## 2014-07-07 ENCOUNTER — Ambulatory Visit (INDEPENDENT_AMBULATORY_CARE_PROVIDER_SITE_OTHER): Payer: 59 | Admitting: Family Medicine

## 2014-07-07 ENCOUNTER — Encounter: Payer: Self-pay | Admitting: Family Medicine

## 2014-07-07 VITALS — BP 157/73 | HR 66 | Ht 71.0 in | Wt 214.0 lb

## 2014-07-07 DIAGNOSIS — M25562 Pain in left knee: Secondary | ICD-10-CM

## 2014-07-07 NOTE — Patient Instructions (Signed)
Your knee pain is most consistent with a medial meniscus tear, less likely mild arthritis. Both are treated similarly. Start with straight leg raises, knee extensions, half squats, hamstring curls 3 sets of 10 once a day. Add ankle weight (or backpack for half squats) if these become too easy. Do the exercises daily for next 6 weeks. Icing or heat as needed (whichever feels better) 15 minutes at a time. X-rays unlikely to be helpful at this stage as both issues are treated similarly at first. Ibuprofen 600mg  three times a day with food OR aleve 2 tabs twice a day with food as needed for pain and inflammation. Knee sleeve or brace for support may help with the giving out sensation. Barber for all activities as tolerated though - as we discussed, deep squats, deep lunges, leg press, a lot of hills and stairs may make pain worse. Follow up with me in about 5-6 weeks.

## 2014-07-08 ENCOUNTER — Other Ambulatory Visit: Payer: Self-pay | Admitting: Family Medicine

## 2014-07-14 DIAGNOSIS — M25562 Pain in left knee: Secondary | ICD-10-CM | POA: Insufficient documentation

## 2014-07-14 NOTE — Progress Notes (Signed)
PCP: Laurey Morale, MD  Subjective:   HPI: Patient is a 49 y.o. male here for left knee pain.  Patient reports he started to get pain medial left knee about 2 months ago. Noticed first after a run - going up stairs felt pain medially. Feels like knee is going to buckle at times. No catching, locking. No swelling. No known injuries.  Past Medical History  Diagnosis Date  . MVP (mitral valve prolapse)   . Hyperlipidemia   . Hypertension   . Gout     Current Outpatient Prescriptions on File Prior to Visit  Medication Sig Dispense Refill  . aspirin 81 MG tablet Take 81 mg by mouth daily.    . fish oil-omega-3 fatty acids 1000 MG capsule Take 2 g by mouth daily.      No current facility-administered medications on file prior to visit.    Past Surgical History  Procedure Laterality Date  . Septoplasty      No Known Allergies  History   Social History  . Marital Status: Married    Spouse Name: N/A  . Number of Children: N/A  . Years of Education: N/A   Occupational History  . Not on file.   Social History Main Topics  . Smoking status: Never Smoker   . Smokeless tobacco: Never Used  . Alcohol Use: 0.0 oz/week    0 Standard drinks or equivalent per week     Comment: rare  . Drug Use: No  . Sexual Activity: Not on file   Other Topics Concern  . Not on file   Social History Narrative    Family History  Problem Relation Age of Onset  . Breast cancer    . Diabetes    . Hyperlipidemia      BP 157/73 mmHg  Pulse 66  Ht 5\' 11"  (1.803 m)  Wt 214 lb (97.07 kg)  BMI 29.86 kg/m2  Review of Systems: See HPI above.    Objective:  Physical Exam:  Gen: NAD  Left knee: No gross deformity, ecchymoses, swelling.   Medial joint line tenderness.  No other tenderness. FROM. Negative ant/post drawers. Negative valgus/varus testing. Negative lachmanns. Positive mcmurrays, apleys, thessalys.  Negative patellar apprehension. NV intact distally.    Assessment  & Plan:  1. Left knee pain - most consistent with a medial meniscus tear.  Will start with conservative treatment.  Home exercise program reviewed.  Icing/heat.  NSAIDs.  Knee brace or sleeve for support.  Activities as tolerated.  F/u in 5-6 weeks.  Consider injection, imaging if not improving.

## 2014-07-14 NOTE — Assessment & Plan Note (Signed)
most consistent with a medial meniscus tear.  Will start with conservative treatment.  Home exercise program reviewed.  Icing/heat.  NSAIDs.  Knee brace or sleeve for support.  Activities as tolerated.  F/u in 5-6 weeks.  Consider injection, imaging if not improving.

## 2014-08-11 ENCOUNTER — Ambulatory Visit (INDEPENDENT_AMBULATORY_CARE_PROVIDER_SITE_OTHER): Payer: 59 | Admitting: Family Medicine

## 2014-08-11 ENCOUNTER — Encounter: Payer: Self-pay | Admitting: Family Medicine

## 2014-08-11 VITALS — BP 142/88 | HR 71 | Ht 71.0 in | Wt 214.0 lb

## 2014-08-11 DIAGNOSIS — M25562 Pain in left knee: Secondary | ICD-10-CM | POA: Diagnosis not present

## 2014-08-13 NOTE — Progress Notes (Signed)
PCP: Laurey Morale, MD  Subjective:   HPI: Patient is a 49 y.o. male here for left knee pain.  3/21: Patient reports he started to get pain medial left knee about 2 months ago. Noticed first after a run - going up stairs felt pain medially. Feels like knee is going to buckle at times. No catching, locking. No swelling. No known injuries.  4/25: Patient reports he feels much better. Gets some pain only with exercise now. No swelling. No catching or locking. No pain currently.  Past Medical History  Diagnosis Date  . MVP (mitral valve prolapse)   . Hyperlipidemia   . Hypertension   . Gout     Current Outpatient Prescriptions on File Prior to Visit  Medication Sig Dispense Refill  . allopurinol (ZYLOPRIM) 100 MG tablet Take 1 tablet (100 mg total) by mouth daily. 90 tablet 2  . aspirin 81 MG tablet Take 81 mg by mouth daily.    . CRESTOR 20 MG tablet TAKE 1 TABLET DAILY 90 tablet 2  . fenofibrate (TRICOR) 145 MG tablet TAKE 1 TABLET DAILY 90 tablet 2  . fish oil-omega-3 fatty acids 1000 MG capsule Take 2 g by mouth daily.     Marland Kitchen losartan (COZAAR) 100 MG tablet TAKE 1 TABLET DAILY 90 tablet 2   No current facility-administered medications on file prior to visit.    Past Surgical History  Procedure Laterality Date  . Septoplasty      No Known Allergies  History   Social History  . Marital Status: Married    Spouse Name: N/A  . Number of Children: N/A  . Years of Education: N/A   Occupational History  . Not on file.   Social History Main Topics  . Smoking status: Never Smoker   . Smokeless tobacco: Never Used  . Alcohol Use: 0.0 oz/week    0 Standard drinks or equivalent per week     Comment: rare  . Drug Use: No  . Sexual Activity: Not on file   Other Topics Concern  . Not on file   Social History Narrative    Family History  Problem Relation Age of Onset  . Breast cancer    . Diabetes    . Hyperlipidemia      BP 142/88 mmHg  Pulse 71  Ht  5\' 11"  (1.803 m)  Wt 214 lb (97.07 kg)  BMI 29.86 kg/m2  Review of Systems: See HPI above.    Objective:  Physical Exam:  Gen: NAD  Left knee: No gross deformity, ecchymoses, swelling.   No medial joint line tenderness.  No other tenderness. FROM. Negative ant/post drawers. Negative valgus/varus testing. Negative lachmanns. Negative mcmurrays, apleys.  Negative patellar apprehension. NV intact distally.    Assessment & Plan:  1. Left knee pain - most consistent with a medial meniscus tear.  Improving with conservative measures and no mechanical symptoms.  Continue HEP for next 6 weeks.  Activities as tolerated.  F/u in 6 weeks or prn.

## 2014-08-13 NOTE — Assessment & Plan Note (Signed)
most consistent with a medial meniscus tear.  Improving with conservative measures and no mechanical symptoms.  Continue HEP for next 6 weeks.  Activities as tolerated.  F/u in 6 weeks or prn.

## 2015-06-29 ENCOUNTER — Other Ambulatory Visit: Payer: Self-pay | Admitting: Family Medicine

## 2015-07-08 IMAGING — US US RENAL
1 series · 14 of 25 positions shown · non-contrast
Comparison: None.

CLINICAL DATA: History of kidney stones, hematuria

EXAM:
RENAL/URINARY TRACT ULTRASOUND COMPLETE

[Series 1: us renal · 0.35mm/px · 14 of 54 slices shown]
[im 1/54]
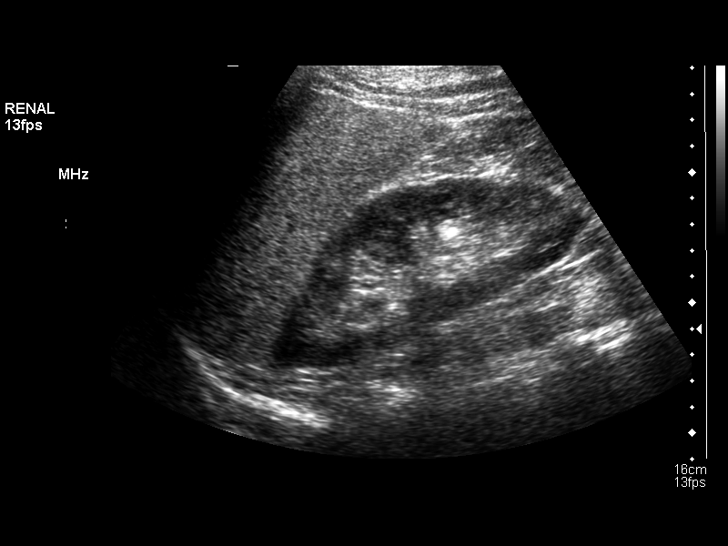
[im 5/54]
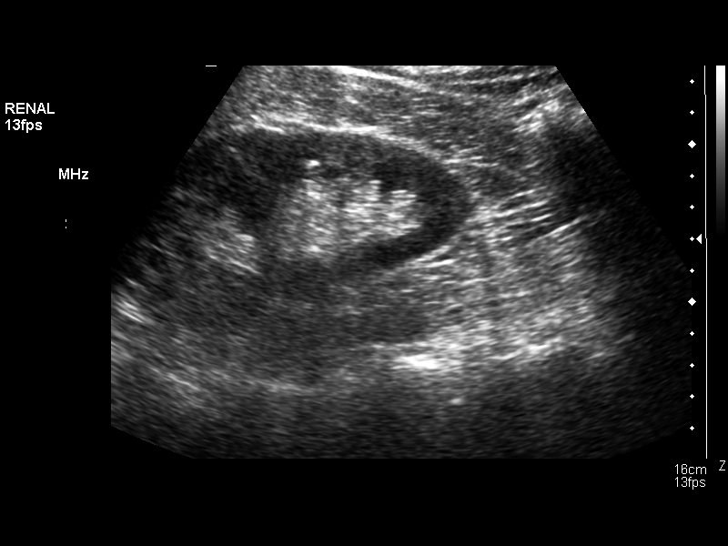
[im 9/54]
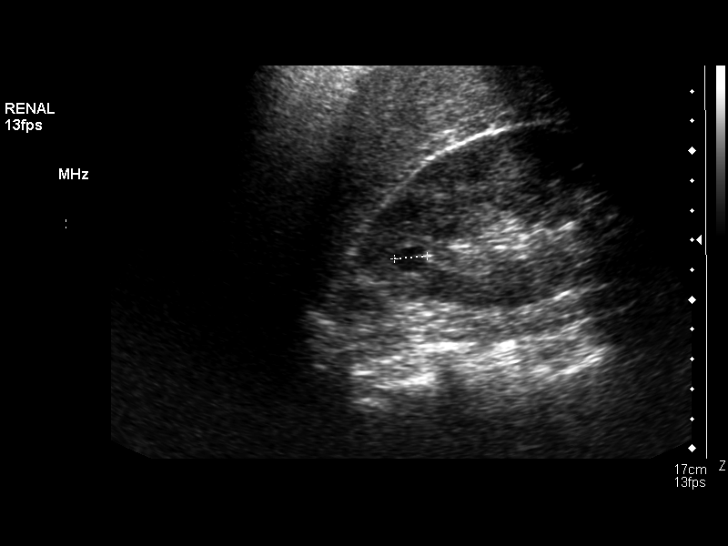
[im 14/54]
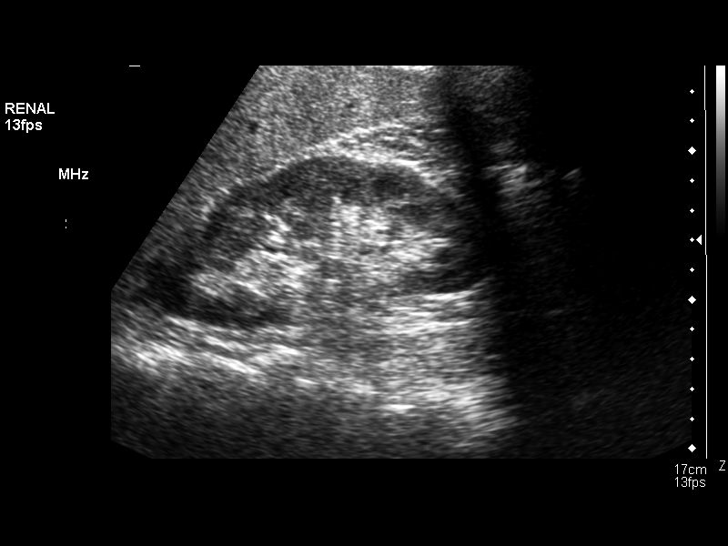
[im 18/54]
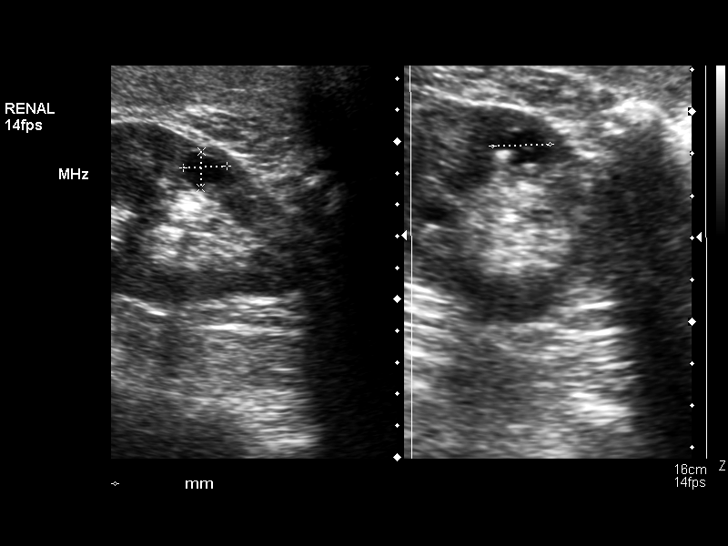
[im 20/54]
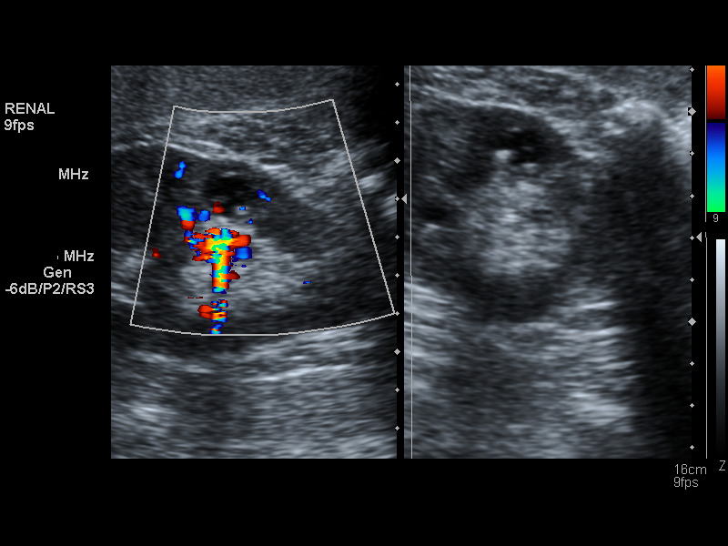
[im 25/54]
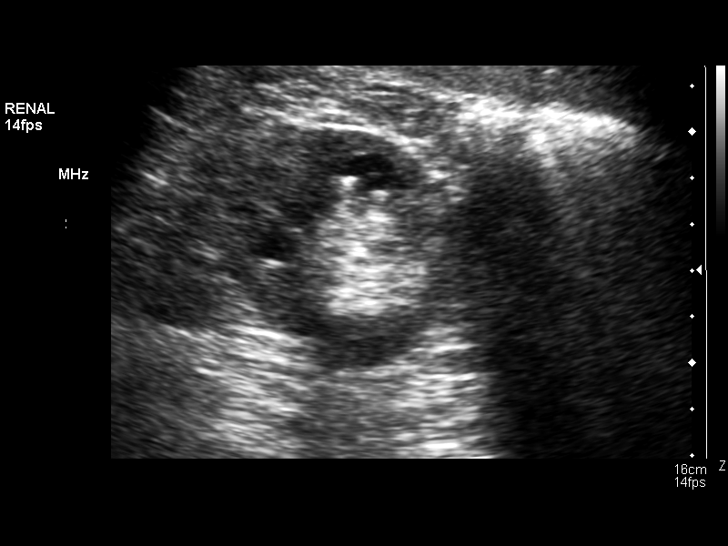
[im 29/54]
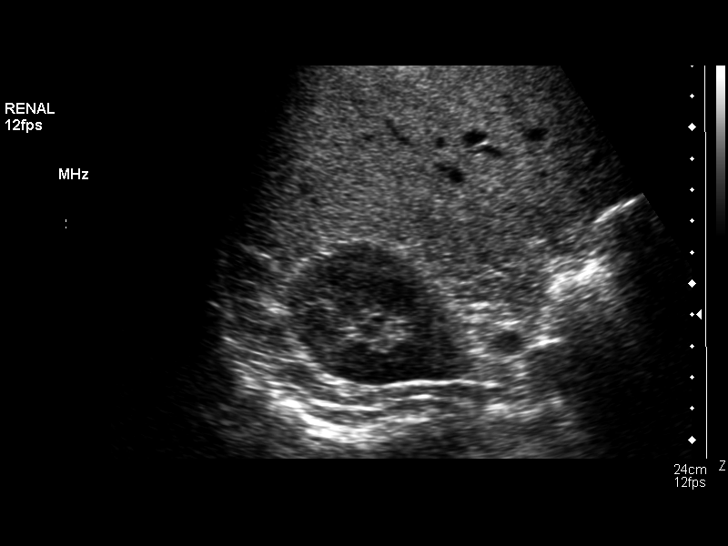
[im 34/54]
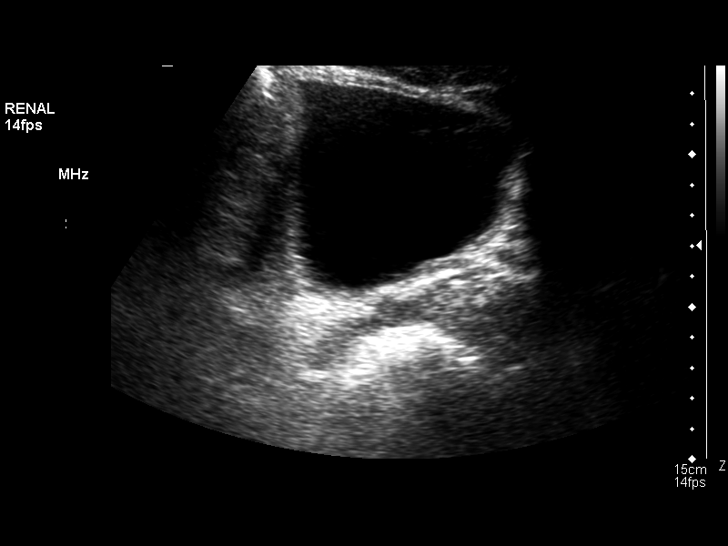
[im 36/54]
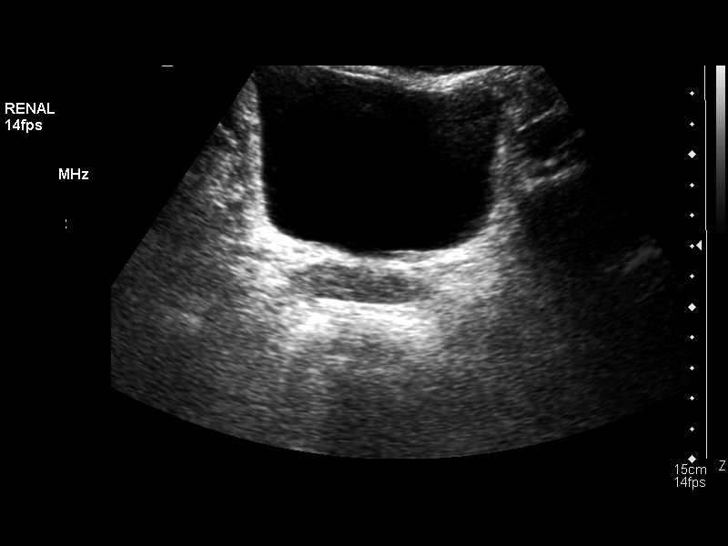
[im 40/54]
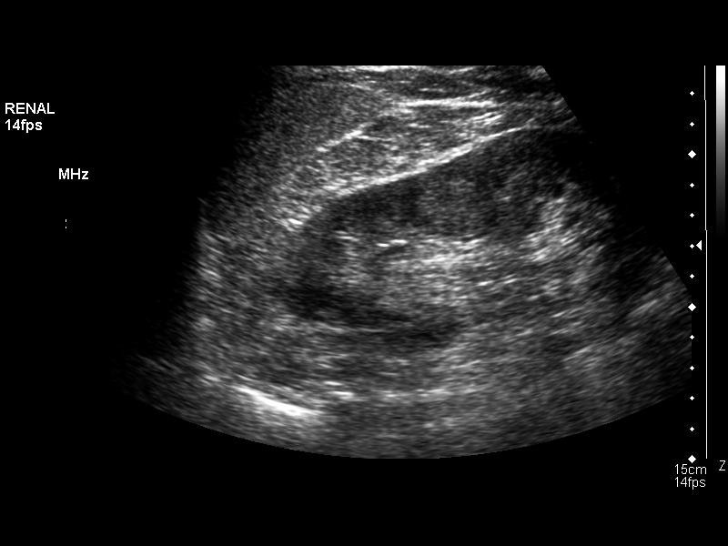
[im 45/54]
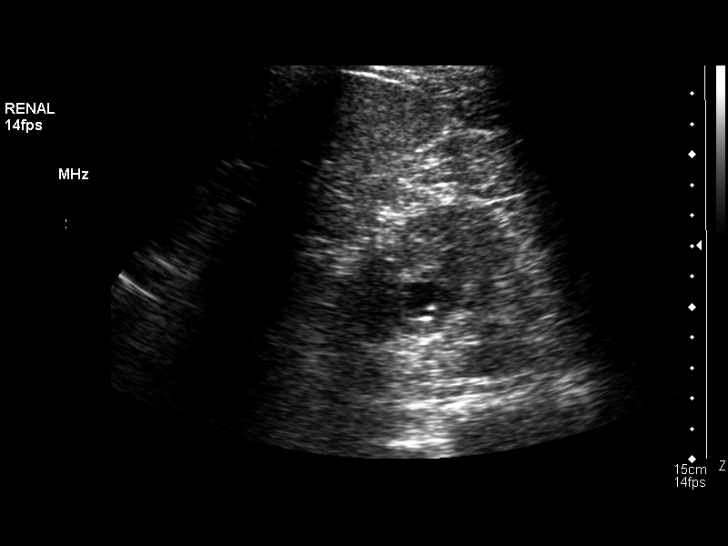
[im 49/54]
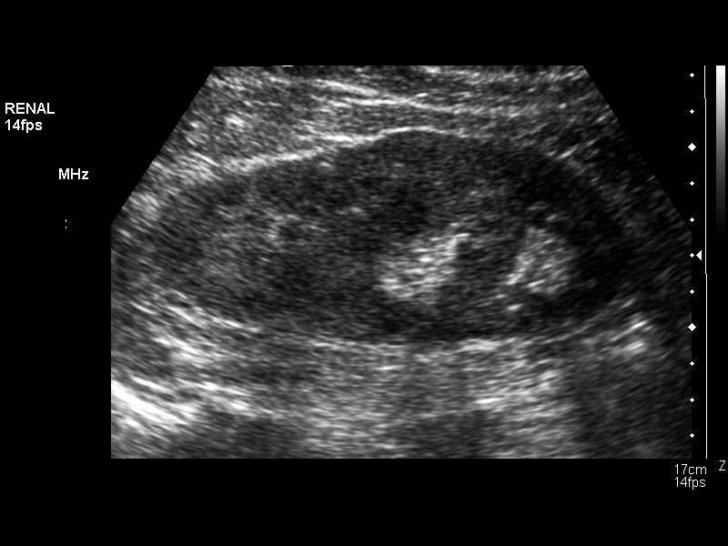
[im 54/54]
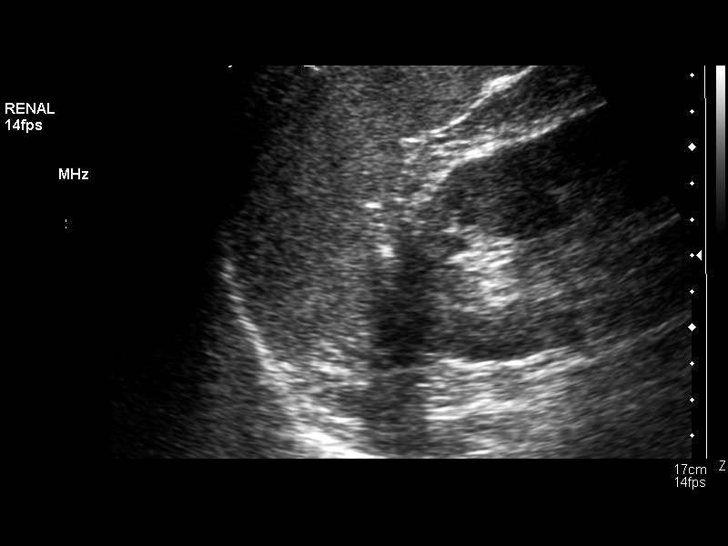

[14 of 25 positions shown; findings below may reference images not displayed]

FINDINGS: Right Kidney

Length: The right kidney measures 13.5 cm sagittally.. No
hydronephrosis is seen. There is and 8 mm calcification in the right
mid kidney. Several small cysts are noted. However, within the
lateral right kidney there is a complex structure of 1.4 cm with
some blood flow. MR would be the best mode of further assessment.

Left Kidney

Length: The left kidney measures 14.0 cm.. No hydronephrosis is
seen. No renal calculi are noted. A small hypoechoic structure is
noted in the upper pole of 1.2 cm diameter.

Bladder

The urinary bladder is unremarkable.
IMPRESSION: 1. 8 mm nonobstructing calculus in the mid lower right kidney.
2. Complex hypoechoic structure of 1.4 cm in the periphery of the
right kidney. Consider further assessment with MR preferably versus
CT.

## 2015-10-22 ENCOUNTER — Other Ambulatory Visit: Payer: Self-pay | Admitting: Family Medicine

## 2015-11-10 ENCOUNTER — Other Ambulatory Visit: Payer: Self-pay | Admitting: Family Medicine

## 2015-11-10 NOTE — Telephone Encounter (Signed)
Pt need new Rx for allopurinol,TRICOR,losartan and CRESTOR  Pharm:  [facility]

## 2015-11-11 MED ORDER — FENOFIBRATE 145 MG PO TABS: 145.0000 mg | ORAL_TABLET | Freq: Every day | ORAL | 0 refills | Status: DC

## 2015-11-11 MED ORDER — ROSUVASTATIN CALCIUM 20 MG PO TABS: 20.0000 mg | ORAL_TABLET | Freq: Every day | ORAL | 0 refills | Status: DC

## 2015-11-11 MED ORDER — ALLOPURINOL 100 MG PO TABS: 100.0000 mg | ORAL_TABLET | Freq: Every day | ORAL | 0 refills | Status: DC

## 2015-11-11 MED ORDER — LOSARTAN POTASSIUM 100 MG PO TABS: 100.0000 mg | ORAL_TABLET | Freq: Every day | ORAL | 0 refills | Status: DC

## 2015-11-11 NOTE — Telephone Encounter (Signed)
Rx's sent to pharmacy.  

## 2015-11-18 ENCOUNTER — Other Ambulatory Visit: Payer: 59

## 2015-11-25 ENCOUNTER — Encounter: Payer: 59 | Admitting: Family Medicine

## 2015-12-16 ENCOUNTER — Other Ambulatory Visit (INDEPENDENT_AMBULATORY_CARE_PROVIDER_SITE_OTHER): Payer: 59

## 2015-12-16 DIAGNOSIS — Z0000 Encounter for general adult medical examination without abnormal findings: Secondary | ICD-10-CM

## 2015-12-16 LAB — BASIC METABOLIC PANEL
BUN: 27 mg/dL — AB (ref 6–23)
CO2: 30 mEq/L (ref 19–32)
CREATININE: 1.3 mg/dL (ref 0.40–1.50)
Calcium: 9.6 mg/dL (ref 8.4–10.5)
Chloride: 101 mEq/L (ref 96–112)
GFR: 62.26 mL/min (ref >60.00)
Glucose, Bld: 83 mg/dL (ref 70–99)
Potassium: 4.5 mEq/L (ref 3.5–5.1)
Sodium: 139 mEq/L (ref 135–145)

## 2015-12-16 LAB — POC URINALSYSI DIPSTICK (AUTOMATED)
Bilirubin, UA: NEGATIVE
GLUCOSE UA: NEGATIVE
KETONES UA: NEGATIVE
Leukocytes, UA: NEGATIVE
Nitrite, UA: NEGATIVE
Protein, UA: NEGATIVE
RBC UA: NEGATIVE
SPEC GRAV UA: 1.025
UROBILINOGEN UA: 0.2
pH, UA: 5.5

## 2015-12-16 LAB — LIPID PANEL
CHOL/HDL RATIO: 5
Cholesterol: 156 mg/dL (ref 0–200)
HDL: 33.3 mg/dL — AB (ref >39.00)
LDL Cholesterol: 84 mg/dL (ref 0–99)
NONHDL: 122.22
Triglycerides: 191 mg/dL — ABNORMAL HIGH (ref 0.0–149.0)
VLDL: 38.2 mg/dL (ref 0.0–40.0)

## 2015-12-16 LAB — CBC WITH DIFFERENTIAL/PLATELET
Basophils Absolute: 0 10*3/uL (ref 0.0–0.1)
Basophils Relative: 0.6 % (ref 0.0–3.0)
EOS PCT: 2 % (ref 0.0–5.0)
Eosinophils Absolute: 0.2 10*3/uL (ref 0.0–0.7)
HCT: 43.1 % (ref 39.0–52.0)
Hemoglobin: 14.9 g/dL (ref 13.0–17.0)
Lymphocytes Relative: 42.7 % (ref 12.0–46.0)
Lymphs Abs: 3.2 10*3/uL (ref 0.7–4.0)
MCHC: 34.7 g/dL (ref 30.0–36.0)
MCV: 90.9 fl (ref 78.0–100.0)
MONO ABS: 0.6 10*3/uL (ref 0.1–1.0)
Monocytes Relative: 7.6 % (ref 3.0–12.0)
NEUTROS PCT: 47.1 % (ref 43.0–77.0)
Neutro Abs: 3.6 10*3/uL (ref 1.4–7.7)
Platelets: 480 10*3/uL — ABNORMAL HIGH (ref 150.0–400.0)
RBC: 4.74 Mil/uL (ref 4.22–5.81)
RDW: 12.6 % (ref 11.5–15.5)
WBC: 7.5 10*3/uL (ref 4.0–10.5)

## 2015-12-16 LAB — HEPATIC FUNCTION PANEL
ALT: 20 U/L (ref 0–53)
AST: 30 U/L (ref 0–37)
Albumin: 4.6 g/dL (ref 3.5–5.2)
Alkaline Phosphatase: 44 U/L (ref 39–117)
BILIRUBIN DIRECT: 0.1 mg/dL (ref 0.0–0.3)
Total Bilirubin: 0.6 mg/dL (ref 0.2–1.2)
Total Protein: 6.9 g/dL (ref 6.0–8.3)

## 2015-12-16 LAB — TSH: TSH: 1.48 u[IU]/mL (ref 0.35–4.50)

## 2015-12-24 ENCOUNTER — Encounter: Payer: Self-pay | Admitting: Family Medicine

## 2015-12-24 ENCOUNTER — Ambulatory Visit (INDEPENDENT_AMBULATORY_CARE_PROVIDER_SITE_OTHER): Payer: 59 | Admitting: Family Medicine

## 2015-12-24 VITALS — BP 113/65 | HR 65 | Temp 98.5°F | Ht 71.0 in | Wt 215.0 lb

## 2015-12-24 DIAGNOSIS — Z23 Encounter for immunization: Secondary | ICD-10-CM

## 2015-12-24 DIAGNOSIS — Z0000 Encounter for general adult medical examination without abnormal findings: Secondary | ICD-10-CM | POA: Diagnosis not present

## 2015-12-24 MED ORDER — ALLOPURINOL 100 MG PO TABS: 100.0000 mg | ORAL_TABLET | Freq: Every day | ORAL | 3 refills | Status: DC

## 2015-12-24 MED ORDER — ROSUVASTATIN CALCIUM 20 MG PO TABS: 20.0000 mg | ORAL_TABLET | Freq: Every day | ORAL | 3 refills | Status: DC

## 2015-12-24 MED ORDER — LOSARTAN POTASSIUM 100 MG PO TABS: 100.0000 mg | ORAL_TABLET | Freq: Every day | ORAL | 3 refills | Status: DC

## 2015-12-24 MED ORDER — FENOFIBRATE 145 MG PO TABS: 145.0000 mg | ORAL_TABLET | Freq: Every day | ORAL | 3 refills | Status: DC

## 2015-12-24 NOTE — Progress Notes (Signed)
   Subjective:    Patient ID: Austin Hughes, male    DOB: October 03, 1965, 50 y.o.   MRN: [id number]  HPI 50 yr old male for a well exam. He feels great and he has not had a single gout attack in the past year. His BP is stable.    Review of Systems  Constitutional: Negative.   HENT: Negative.   Eyes: Negative.   Respiratory: Negative.   Cardiovascular: Negative.   Gastrointestinal: Negative.   Genitourinary: Negative.   Musculoskeletal: Negative.   Skin: Negative.   Neurological: Negative.   Psychiatric/Behavioral: Negative.        Objective:   Physical Exam  Constitutional: He is oriented to person, place, and time. He appears well-developed and well-nourished. No distress.  HENT:  Head: Normocephalic and atraumatic.  Right Ear: External ear normal.  Left Ear: External ear normal.  Nose: Nose normal.  Mouth/Throat: Oropharynx is clear and moist. No oropharyngeal exudate.  Eyes: Conjunctivae and EOM are normal. Pupils are equal, round, and reactive to light. Right eye exhibits no discharge. Left eye exhibits no discharge. No scleral icterus.  Neck: Neck supple. No JVD present. No tracheal deviation present. No thyromegaly present.  Cardiovascular: Normal rate, regular rhythm, normal heart sounds and intact distal pulses.  Exam reveals no gallop and no friction rub.   No murmur heard. Pulmonary/Chest: Effort normal and breath sounds normal. No respiratory distress. He has no wheezes. He has no rales. He exhibits no tenderness.  Abdominal: Soft. Bowel sounds are normal. He exhibits no distension and no mass. There is no tenderness. There is no rebound and no guarding.  Genitourinary: Rectum normal, prostate normal and penis normal. Rectal exam shows guaiac negative stool. No penile tenderness.  Musculoskeletal: Normal range of motion. He exhibits no edema or tenderness.  Lymphadenopathy:    He has no cervical adenopathy.  Neurological: He is alert and oriented to person, place, and  time. He has normal reflexes. No cranial nerve deficit. He exhibits normal muscle tone. Coordination normal.  Skin: Skin is warm and dry. No rash noted. He is not diaphoretic. No erythema. No pallor.  Psychiatric: He has a normal mood and affect. His behavior is normal. Judgment and thought content normal.          Assessment & Plan:  Well exam. We discussed diet and exercise.  Laurey Morale, MD

## 2015-12-24 NOTE — Progress Notes (Signed)
Pre visit review using our clinic review tool, if applicable. No additional management support is needed unless otherwise documented below in the visit note. 

## 2015-12-24 NOTE — Progress Notes (Signed)
   Subjective:    Patient ID: Austin Hughes, male    DOB: Oct 19, 1965, 50 y.o.   MRN: [id number]  HPI    Review of Systems     Objective:   Physical Exam        Assessment & Plan:

## 2017-01-05 ENCOUNTER — Encounter: Payer: Self-pay | Admitting: Family Medicine

## 2017-01-07 ENCOUNTER — Other Ambulatory Visit: Payer: Self-pay | Admitting: Family Medicine

## 2017-01-11 ENCOUNTER — Other Ambulatory Visit: Payer: Self-pay | Admitting: Family Medicine

## 2017-01-12 NOTE — Telephone Encounter (Signed)
I spoke with pt, he has CPE here on 01/26/2017. I will send in all 4 scripts.

## 2017-01-26 ENCOUNTER — Ambulatory Visit (INDEPENDENT_AMBULATORY_CARE_PROVIDER_SITE_OTHER): Payer: 59 | Admitting: Family Medicine

## 2017-01-26 ENCOUNTER — Encounter: Payer: Self-pay | Admitting: Family Medicine

## 2017-01-26 VITALS — BP 122/85 | HR 76 | Temp 98.4°F | Ht 71.0 in | Wt 216.0 lb

## 2017-01-26 DIAGNOSIS — M1000 Idiopathic gout, unspecified site: Secondary | ICD-10-CM

## 2017-01-26 DIAGNOSIS — Z125 Encounter for screening for malignant neoplasm of prostate: Secondary | ICD-10-CM | POA: Diagnosis not present

## 2017-01-26 DIAGNOSIS — Z23 Encounter for immunization: Secondary | ICD-10-CM

## 2017-01-26 DIAGNOSIS — Z0000 Encounter for general adult medical examination without abnormal findings: Secondary | ICD-10-CM

## 2017-01-26 LAB — CBC WITH DIFFERENTIAL/PLATELET
BASOS ABS: 0.1 10*3/uL (ref 0.0–0.1)
Basophils Relative: 1 % (ref 0.0–3.0)
EOS ABS: 0.3 10*3/uL (ref 0.0–0.7)
Eosinophils Relative: 4.2 % (ref 0.0–5.0)
HEMATOCRIT: 48.5 % (ref 39.0–52.0)
HEMOGLOBIN: 16.5 g/dL (ref 13.0–17.0)
LYMPHS PCT: 45.3 % (ref 12.0–46.0)
Lymphs Abs: 3 10*3/uL (ref 0.7–4.0)
MCHC: 34 g/dL (ref 30.0–36.0)
MCV: 92 fl (ref 78.0–100.0)
MONO ABS: 0.4 10*3/uL (ref 0.1–1.0)
Monocytes Relative: 6.5 % (ref 3.0–12.0)
NEUTROS ABS: 2.8 10*3/uL (ref 1.4–7.7)
Neutrophils Relative %: 43 % (ref 43.0–77.0)
PLATELETS: 379 10*3/uL (ref 150.0–400.0)
RBC: 5.27 Mil/uL (ref 4.22–5.81)
RDW: 12.3 % (ref 11.5–15.5)
WBC: 6.6 10*3/uL (ref 4.0–10.5)

## 2017-01-26 LAB — BASIC METABOLIC PANEL
BUN: 19 mg/dL (ref 6–23)
CALCIUM: 9.9 mg/dL (ref 8.4–10.5)
CO2: 29 mEq/L (ref 19–32)
Chloride: 99 mEq/L (ref 96–112)
Creatinine, Ser: 1.19 mg/dL (ref 0.40–1.50)
GFR: 68.63 mL/min (ref >60.00)
Glucose, Bld: 91 mg/dL (ref 70–99)
POTASSIUM: 4.1 meq/L (ref 3.5–5.1)
SODIUM: 137 meq/L (ref 135–145)

## 2017-01-26 LAB — POC URINALSYSI DIPSTICK (AUTOMATED)
BILIRUBIN UA: NEGATIVE
GLUCOSE UA: NEGATIVE
Ketones, UA: NEGATIVE
LEUKOCYTES UA: NEGATIVE
NITRITE UA: NEGATIVE
PH UA: 6 (ref 5.0–8.0)
Protein, UA: NEGATIVE
RBC UA: NEGATIVE
Spec Grav, UA: 1.025 (ref 1.010–1.025)
Urobilinogen, UA: 0.2 E.U./dL

## 2017-01-26 LAB — HEPATIC FUNCTION PANEL
ALK PHOS: 53 U/L (ref 39–117)
ALT: 21 U/L (ref 0–53)
AST: 23 U/L (ref 0–37)
Albumin: 5 g/dL (ref 3.5–5.2)
BILIRUBIN TOTAL: 0.8 mg/dL (ref 0.2–1.2)
Bilirubin, Direct: 0.2 mg/dL (ref 0.0–0.3)
Total Protein: 7.3 g/dL (ref 6.0–8.3)

## 2017-01-26 LAB — LIPID PANEL
CHOL/HDL RATIO: 5
Cholesterol: 189 mg/dL (ref 0–200)
HDL: 39.6 mg/dL (ref >39.00)
NonHDL: 149.02
Triglycerides: 248 mg/dL — ABNORMAL HIGH (ref 0.0–149.0)
VLDL: 49.6 mg/dL — AB (ref 0.0–40.0)

## 2017-01-26 LAB — LDL CHOLESTEROL, DIRECT: Direct LDL: 115 mg/dL

## 2017-01-26 LAB — PSA: PSA: 0.74 ng/mL (ref 0.10–4.00)

## 2017-01-26 LAB — TSH: TSH: 1.67 u[IU]/mL (ref 0.35–4.50)

## 2017-01-26 LAB — URIC ACID: URIC ACID, SERUM: 6.1 mg/dL (ref 4.0–7.8)

## 2017-01-26 MED ORDER — ALLOPURINOL 100 MG PO TABS: 100.0000 mg | ORAL_TABLET | Freq: Every day | ORAL | 3 refills | Status: DC

## 2017-01-26 MED ORDER — FENOFIBRATE 145 MG PO TABS: 145.0000 mg | ORAL_TABLET | Freq: Every day | ORAL | 3 refills | Status: DC

## 2017-01-26 MED ORDER — ROSUVASTATIN CALCIUM 20 MG PO TABS: 20.0000 mg | ORAL_TABLET | Freq: Every day | ORAL | 3 refills | Status: DC

## 2017-01-26 MED ORDER — LOSARTAN POTASSIUM 100 MG PO TABS: 100.0000 mg | ORAL_TABLET | Freq: Every day | ORAL | 3 refills | Status: DC

## 2017-01-26 NOTE — Patient Instructions (Signed)
WE NOW OFFER   [facility] FAST TRACK!!!  SAME DAY Appointments for ACUTE CARE  Such as: Sprains, Injuries, cuts, abrasions, rashes, muscle pain, joint pain, back pain Colds, flu, sore throats, headache, allergies, cough, fever  Ear pain, sinus and eye infections Abdominal pain, nausea, vomiting, diarrhea, upset stomach Animal/insect bites  3 Easy Ways to Schedule: Walk-In Scheduling Call in scheduling Mychart Sign-up: [email or web address]         

## 2017-01-26 NOTE — Progress Notes (Signed)
   Subjective:    Patient ID: Austin Hughes, male    DOB: 1966/03/14, 51 y.o.   MRN: [id number]  HPI Here for a well exam. He feels great.    Review of Systems  Constitutional: Negative.   HENT: Negative.   Eyes: Negative.   Respiratory: Negative.   Cardiovascular: Negative.   Gastrointestinal: Negative.   Genitourinary: Negative.   Musculoskeletal: Negative.   Skin: Negative.   Neurological: Negative.   Psychiatric/Behavioral: Negative.        Objective:   Physical Exam  Constitutional: He is oriented to person, place, and time. He appears well-developed and well-nourished. No distress.  HENT:  Head: Normocephalic and atraumatic.  Right Ear: External ear normal.  Left Ear: External ear normal.  Nose: Nose normal.  Mouth/Throat: Oropharynx is clear and moist. No oropharyngeal exudate.  Eyes: Pupils are equal, round, and reactive to light. Conjunctivae and EOM are normal. Right eye exhibits no discharge. Left eye exhibits no discharge. No scleral icterus.  Neck: Neck supple. No JVD present. No tracheal deviation present. No thyromegaly present.  Cardiovascular: Normal rate, regular rhythm, normal heart sounds and intact distal pulses.  Exam reveals no gallop and no friction rub.   No murmur heard. Pulmonary/Chest: Effort normal and breath sounds normal. No respiratory distress. He has no wheezes. He has no rales. He exhibits no tenderness.  Abdominal: Soft. Bowel sounds are normal. He exhibits no distension and no mass. There is no tenderness. There is no rebound and no guarding.  Genitourinary: Rectum normal, prostate normal and penis normal. Rectal exam shows guaiac negative stool. No penile tenderness.  Musculoskeletal: Normal range of motion. He exhibits no edema or tenderness.  Lymphadenopathy:    He has no cervical adenopathy.  Neurological: He is alert and oriented to person, place, and time. He has normal reflexes. No cranial nerve deficit. He exhibits normal muscle  tone. Coordination normal.  Skin: Skin is warm and dry. No rash noted. He is not diaphoretic. No erythema. No pallor.  Psychiatric: He has a normal mood and affect. His behavior is normal. Judgment and thought content normal.          Assessment & Plan:  Well exam. We discussed diet and exercise. Get fasting labs today. Set up his first colonoscopy.  Alysia Penna, MD

## 2017-03-02 ENCOUNTER — Encounter: Payer: Self-pay | Admitting: Gastroenterology

## 2017-04-05 ENCOUNTER — Ambulatory Visit (AMBULATORY_SURGERY_CENTER): Payer: Self-pay | Admitting: *Deleted

## 2017-04-05 ENCOUNTER — Other Ambulatory Visit: Payer: Self-pay

## 2017-04-05 VITALS — Ht 71.0 in | Wt 219.0 lb

## 2017-04-05 DIAGNOSIS — Z1211 Encounter for screening for malignant neoplasm of colon: Secondary | ICD-10-CM

## 2017-04-05 MED ORDER — PEG 3350-KCL-NA BICARB-NACL 420 G PO SOLR: 4000.0000 mL | Freq: Once | ORAL | 0 refills | Status: AC

## 2017-04-05 NOTE — Progress Notes (Signed)
No egg or soy allergy known to patient  No issues with past sedation with any surgeries  or procedures, no intubation problems  No diet pills per patient No home 02 use per patient  No blood thinners per patient  Pt denies issues with constipation  No A fib or A flutter  EMMI video sent to pt's e mail pt declined   

## 2017-04-28 ENCOUNTER — Encounter: Payer: 59 | Admitting: Gastroenterology

## 2017-05-16 ENCOUNTER — Encounter: Payer: Self-pay | Admitting: Gastroenterology

## 2017-05-29 ENCOUNTER — Ambulatory Visit (AMBULATORY_SURGERY_CENTER): Payer: 59 | Admitting: Gastroenterology

## 2017-05-29 ENCOUNTER — Encounter: Payer: Self-pay | Admitting: Gastroenterology

## 2017-05-29 VITALS — BP 100/58 | HR 71 | Temp 98.2°F | Resp 11 | Ht 71.0 in | Wt 219.0 lb

## 2017-05-29 DIAGNOSIS — D120 Benign neoplasm of cecum: Secondary | ICD-10-CM

## 2017-05-29 DIAGNOSIS — Z1212 Encounter for screening for malignant neoplasm of rectum: Secondary | ICD-10-CM

## 2017-05-29 DIAGNOSIS — Z1211 Encounter for screening for malignant neoplasm of colon: Secondary | ICD-10-CM | POA: Diagnosis not present

## 2017-05-29 HISTORY — PX: COLONOSCOPY: SHX174

## 2017-05-29 MED ORDER — SODIUM CHLORIDE 0.9 % IV SOLN: 500.0000 mL | Freq: Once | INTRAVENOUS | Status: DC

## 2017-05-29 NOTE — Progress Notes (Signed)
Report given to PACU, vss 

## 2017-05-29 NOTE — Op Note (Signed)
[facility] Patient Name: Austin Hughes Procedure Date: 05/29/2017 11:04 AM MRN: [id number] Endoscopist: Milus Banister , MD Age: 52 Referring MD:  Date of Birth: 09-Mar-1966 Gender: Male Account #: [id number] Procedure:                Colonoscopy Indications:              Screening for colorectal malignant neoplasm Medicines:                Monitored Anesthesia Care Procedure:                Pre-Anesthesia Assessment:                           - Prior to the procedure, a History and Physical                            was performed, and patient medications and                            allergies were reviewed. The patient's tolerance of                            previous anesthesia was also reviewed. The risks                            and benefits of the procedure and the sedation                            options and risks were discussed with the patient.                            All questions were answered, and informed consent                            was obtained. Prior Anticoagulants: The patient has                            taken no previous anticoagulant or antiplatelet                            agents. ASA Grade Assessment: II - A patient with                            mild systemic disease. After reviewing the risks                            and benefits, the patient was deemed in                            satisfactory condition to undergo the procedure.                           After obtaining informed consent, the colonoscope  was passed under direct vision. Throughout the                            procedure, the patient's blood pressure, pulse, and                            oxygen saturations were monitored continuously. The                            Colonoscope was introduced through the anus and                            advanced to the the cecum, identified by                            appendiceal orifice and  ileocecal valve. The                            colonoscopy was performed without difficulty. The                            patient tolerated the procedure well. The quality                            of the bowel preparation was excellent. The                            ileocecal valve, appendiceal orifice, and rectum                            were photographed. Scope In: 11:11:38 AM Scope Out: 11:22:25 AM Scope Withdrawal Time: 0 hours 8 minutes 56 seconds  Total Procedure Duration: 0 hours 10 minutes 47 seconds  Findings:                 A 9 mm polyp was found in the cecum. The polyp was                            sessile. The polyp was removed with a cold snare.                            Resection and retrieval were complete.                           The exam was otherwise without abnormality on                            direct and retroflexion views. Complications:            No immediate complications. Estimated blood loss:                            None. Estimated Blood Loss:     Estimated blood loss: none. Impression:               -  One 9 mm polyp in the cecum, removed with a cold                            snare. Resected and retrieved.                           - The examination was otherwise normal on direct                            and retroflexion views. Recommendation:           - Patient has a contact number available for                            emergencies. The signs and symptoms of potential                            delayed complications were discussed with the                            patient. Return to normal activities tomorrow.                            Written discharge instructions were provided to the                            patient.                           - Resume previous diet.                           - Continue present medications.                           You will receive a letter within 2-3 weeks with the                             pathology results and my final recommendations.                           If the polyp(s) is proven to be 'pre-cancerous' on                            pathology, you will need repeat colonoscopy in 5                            years. If the polyp(s) is NOT 'precancerous' on                            pathology then you should repeat colon cancer                            screening in 10 years with colonoscopy without need  for colon cancer screening by any method prior to                            then (including stool testing). Milus Banister, MD 05/29/2017 11:27:21 AM This report has been signed electronically.

## 2017-05-29 NOTE — Progress Notes (Signed)
Called to room to assist during endoscopic procedure.  Patient ID and intended procedure confirmed with present staff. Received instructions for my participation in the procedure from the performing physician.  

## 2017-05-29 NOTE — Patient Instructions (Signed)
Handout given for polyp.  YOU HAD AN ENDOSCOPIC PROCEDURE TODAY AT THE [facility]:   Refer to the procedure report that was given to you for any specific questions about what was found during the examination.  If the procedure report does not answer your questions, please call your gastroenterologist to clarify.  If you requested that your care partner not be given the details of your procedure findings, then the procedure report has been included in a sealed envelope for you to review at your convenience later.  YOU SHOULD EXPECT: Some feelings of bloating in the abdomen. Passage of more gas than usual.  Walking can help get rid of the air that was put into your GI tract during the procedure and reduce the bloating. If you had a lower endoscopy (such as a colonoscopy or flexible sigmoidoscopy) you may notice spotting of blood in your stool or on the toilet paper. If you underwent a bowel prep for your procedure, you may not have a normal bowel movement for a few days.  Please Note:  You might notice some irritation and congestion in your nose or some drainage.  This is from the oxygen used during your procedure.  There is no need for concern and it should clear up in a day or so.  SYMPTOMS TO REPORT IMMEDIATELY:   Following lower endoscopy (colonoscopy or flexible sigmoidoscopy):  Excessive amounts of blood in the stool  Significant tenderness or worsening of abdominal pains  Swelling of the abdomen that is new, acute  Fever of 100F or higher  For urgent or emergent issues, a gastroenterologist can be reached at any hour by calling [phone].   DIET:  We do recommend a small meal at first, but then you may proceed to your regular diet.  Drink plenty of fluids but you should avoid alcoholic beverages for 24 hours.  ACTIVITY:  You should plan to take it easy for the rest of today and you should NOT DRIVE or use heavy machinery until tomorrow (because of the sedation medicines  used during the test).    FOLLOW UP: Our staff will call the number listed on your records the next business day following your procedure to check on you and address any questions or concerns that you may have regarding the information given to you following your procedure. If we do not reach you, we will leave a message.  However, if you are feeling well and you are not experiencing any problems, there is no need to return our call.  We will assume that you have returned to your regular daily activities without incident.  If any biopsies were taken you will be contacted by phone or by letter within the next 1-3 weeks.  Please call us at [phone] if you have not heard about the biopsies in 3 weeks.    SIGNATURES/CONFIDENTIALITY: You and/or your care partner have signed paperwork which will be entered into your electronic medical record.  These signatures attest to the fact that that the information above on your After Visit Summary has been reviewed and is understood.  Full responsibility of the confidentiality of this discharge information lies with you and/or your care-partner. 

## 2017-05-30 ENCOUNTER — Telehealth: Payer: Self-pay | Admitting: *Deleted

## 2017-05-30 NOTE — Telephone Encounter (Signed)
  Follow up Call-  Call back number 05/29/2017  Post procedure Call Back phone  # [phone]  Permission to leave phone message Yes  Some recent data might be hidden     Patient questions:  Do you have a fever, pain , or abdominal swelling? No. Pain Score  0 *  Have you tolerated food without any problems? Yes.    Have you been able to return to your normal activities? Yes.    Do you have any questions about your discharge instructions: Diet   No. Medications  No. Follow up visit  No.  Do you have questions or concerns about your Care? No.  Actions: * If pain score is 4 or above: No action needed, pain <4.

## 2017-06-02 ENCOUNTER — Encounter: Payer: Self-pay | Admitting: Gastroenterology

## 2018-01-29 ENCOUNTER — Encounter: Payer: Self-pay | Admitting: Family Medicine

## 2018-01-29 ENCOUNTER — Ambulatory Visit (INDEPENDENT_AMBULATORY_CARE_PROVIDER_SITE_OTHER): Payer: 59 | Admitting: Family Medicine

## 2018-01-29 VITALS — BP 116/70 | HR 78 | Temp 98.4°F | Ht 71.0 in | Wt 215.4 lb

## 2018-01-29 DIAGNOSIS — M1000 Idiopathic gout, unspecified site: Secondary | ICD-10-CM | POA: Diagnosis not present

## 2018-01-29 DIAGNOSIS — Z0000 Encounter for general adult medical examination without abnormal findings: Secondary | ICD-10-CM

## 2018-01-29 DIAGNOSIS — Z23 Encounter for immunization: Secondary | ICD-10-CM

## 2018-01-29 LAB — LIPID PANEL
CHOL/HDL RATIO: 4
Cholesterol: 159 mg/dL (ref 0–200)
HDL: 36 mg/dL — AB (ref >39.00)
LDL CALC: 83 mg/dL (ref 0–99)
NonHDL: 122.89
Triglycerides: 197 mg/dL — ABNORMAL HIGH (ref 0.0–149.0)
VLDL: 39.4 mg/dL (ref 0.0–40.0)

## 2018-01-29 LAB — URIC ACID: URIC ACID, SERUM: 5.1 mg/dL (ref 4.0–7.8)

## 2018-01-29 LAB — BASIC METABOLIC PANEL
BUN: 23 mg/dL (ref 6–23)
CALCIUM: 10 mg/dL (ref 8.4–10.5)
CO2: 32 meq/L (ref 19–32)
CREATININE: 1.27 mg/dL (ref 0.40–1.50)
Chloride: 101 mEq/L (ref 96–112)
GFR: 63.41 mL/min (ref >60.00)
Glucose, Bld: 97 mg/dL (ref 70–99)
Potassium: 4.5 mEq/L (ref 3.5–5.1)
Sodium: 139 mEq/L (ref 135–145)

## 2018-01-29 LAB — POC URINALSYSI DIPSTICK (AUTOMATED)
Bilirubin, UA: NEGATIVE
Glucose, UA: NEGATIVE
KETONES UA: NEGATIVE
Leukocytes, UA: NEGATIVE
Nitrite, UA: NEGATIVE
PH UA: 6 (ref 5.0–8.0)
PROTEIN UA: NEGATIVE
RBC UA: NEGATIVE
SPEC GRAV UA: 1.02 (ref 1.010–1.025)
Urobilinogen, UA: 0.2 E.U./dL

## 2018-01-29 LAB — CBC WITH DIFFERENTIAL/PLATELET
BASOS ABS: 0.1 10*3/uL (ref 0.0–0.1)
Basophils Relative: 0.8 % (ref 0.0–3.0)
EOS ABS: 0.3 10*3/uL (ref 0.0–0.7)
Eosinophils Relative: 3.6 % (ref 0.0–5.0)
HCT: 45.7 % (ref 39.0–52.0)
HEMOGLOBIN: 15.7 g/dL (ref 13.0–17.0)
LYMPHS ABS: 2.9 10*3/uL (ref 0.7–4.0)
Lymphocytes Relative: 37.1 % (ref 12.0–46.0)
MCHC: 34.4 g/dL (ref 30.0–36.0)
MCV: 91.5 fl (ref 78.0–100.0)
MONO ABS: 0.5 10*3/uL (ref 0.1–1.0)
Monocytes Relative: 6 % (ref 3.0–12.0)
NEUTROS PCT: 52.5 % (ref 43.0–77.0)
Neutro Abs: 4.2 10*3/uL (ref 1.4–7.7)
Platelets: 388 10*3/uL (ref 150.0–400.0)
RBC: 4.99 Mil/uL (ref 4.22–5.81)
RDW: 12.4 % (ref 11.5–15.5)
WBC: 7.9 10*3/uL (ref 4.0–10.5)

## 2018-01-29 LAB — HEPATIC FUNCTION PANEL
ALT: 19 U/L (ref 0–53)
AST: 20 U/L (ref 0–37)
Albumin: 4.5 g/dL (ref 3.5–5.2)
Alkaline Phosphatase: 48 U/L (ref 39–117)
BILIRUBIN DIRECT: 0.1 mg/dL (ref 0.0–0.3)
TOTAL PROTEIN: 6.7 g/dL (ref 6.0–8.3)
Total Bilirubin: 0.5 mg/dL (ref 0.2–1.2)

## 2018-01-29 LAB — PSA: PSA: 0.72 ng/mL (ref 0.10–4.00)

## 2018-01-29 LAB — TSH: TSH: 1.45 u[IU]/mL (ref 0.35–4.50)

## 2018-01-29 MED ORDER — ALLOPURINOL 100 MG PO TABS: 100.0000 mg | ORAL_TABLET | Freq: Every day | ORAL | 3 refills | Status: DC

## 2018-01-29 MED ORDER — FENOFIBRATE 145 MG PO TABS: 145.0000 mg | ORAL_TABLET | Freq: Every day | ORAL | 3 refills | Status: DC

## 2018-01-29 MED ORDER — LOSARTAN POTASSIUM 100 MG PO TABS: 100.0000 mg | ORAL_TABLET | Freq: Every day | ORAL | 3 refills | Status: DC

## 2018-01-29 MED ORDER — ROSUVASTATIN CALCIUM 20 MG PO TABS: 20.0000 mg | ORAL_TABLET | Freq: Every day | ORAL | 3 refills | Status: DC

## 2018-01-29 NOTE — Progress Notes (Signed)
   Subjective:    Patient ID: Austin Hughes, male    DOB: 1965/12/29, 52 y.o.   MRN: [id number]  HPI Here for a well exam. He feels great. He has not had a gout episode for over a year.    Review of Systems  Constitutional: Negative.   HENT: Negative.   Eyes: Negative.   Respiratory: Negative.   Cardiovascular: Negative.   Gastrointestinal: Negative.   Genitourinary: Negative.   Musculoskeletal: Negative.   Skin: Negative.   Neurological: Negative.   Psychiatric/Behavioral: Negative.        Objective:   Physical Exam  Constitutional: He is oriented to person, place, and time. He appears well-developed and well-nourished. No distress.  HENT:  Head: Normocephalic and atraumatic.  Right Ear: External ear normal.  Left Ear: External ear normal.  Nose: Nose normal.  Mouth/Throat: Oropharynx is clear and moist. No oropharyngeal exudate.  Eyes: Pupils are equal, round, and reactive to light. Conjunctivae and EOM are normal. Right eye exhibits no discharge. Left eye exhibits no discharge. No scleral icterus.  Neck: Neck supple. No JVD present. No tracheal deviation present. No thyromegaly present.  Cardiovascular: Normal rate, regular rhythm, normal heart sounds and intact distal pulses. Exam reveals no gallop and no friction rub.  No murmur heard. Pulmonary/Chest: Effort normal and breath sounds normal. No respiratory distress. He has no wheezes. He has no rales. He exhibits no tenderness.  Abdominal: Soft. Bowel sounds are normal. He exhibits no distension and no mass. There is no tenderness. There is no rebound and no guarding.  Genitourinary: Rectum normal, prostate normal and penis normal. Rectal exam shows guaiac negative stool. No penile tenderness.  Musculoskeletal: Normal range of motion. He exhibits no edema or tenderness.  Lymphadenopathy:    He has no cervical adenopathy.  Neurological: He is alert and oriented to person, place, and time. He has normal reflexes. He displays  normal reflexes. No cranial nerve deficit. He exhibits normal muscle tone. Coordination normal.  Skin: Skin is warm and dry. No rash noted. He is not diaphoretic. No erythema. No pallor.  Psychiatric: He has a normal mood and affect. His behavior is normal. Judgment and thought content normal.          Assessment & Plan:  Well exam. We discussed diet and exercise. Get fasting labs.  Alysia Penna, MD

## 2018-01-31 ENCOUNTER — Encounter: Payer: Self-pay | Admitting: *Deleted

## 2018-04-18 HISTORY — PX: BICEPS TENDON REPAIR: SHX566

## 2018-08-03 DIAGNOSIS — M79601 Pain in right arm: Secondary | ICD-10-CM | POA: Diagnosis not present

## 2018-08-06 DIAGNOSIS — M79601 Pain in right arm: Secondary | ICD-10-CM | POA: Diagnosis not present

## 2018-08-09 DIAGNOSIS — Z79899 Other long term (current) drug therapy: Secondary | ICD-10-CM | POA: Diagnosis not present

## 2018-08-09 DIAGNOSIS — I10 Essential (primary) hypertension: Secondary | ICD-10-CM | POA: Diagnosis not present

## 2018-08-09 DIAGNOSIS — Z7982 Long term (current) use of aspirin: Secondary | ICD-10-CM | POA: Diagnosis not present

## 2018-08-09 DIAGNOSIS — Z4789 Encounter for other orthopedic aftercare: Secondary | ICD-10-CM | POA: Diagnosis not present

## 2018-08-09 DIAGNOSIS — S46211A Strain of muscle, fascia and tendon of other parts of biceps, right arm, initial encounter: Secondary | ICD-10-CM | POA: Diagnosis not present

## 2018-08-09 DIAGNOSIS — E785 Hyperlipidemia, unspecified: Secondary | ICD-10-CM | POA: Diagnosis not present

## 2018-08-09 DIAGNOSIS — M109 Gout, unspecified: Secondary | ICD-10-CM | POA: Diagnosis not present

## 2018-08-09 DIAGNOSIS — G8918 Other acute postprocedural pain: Secondary | ICD-10-CM | POA: Diagnosis not present

## 2018-08-15 DIAGNOSIS — M79601 Pain in right arm: Secondary | ICD-10-CM | POA: Diagnosis not present

## 2018-08-21 DIAGNOSIS — M79601 Pain in right arm: Secondary | ICD-10-CM | POA: Diagnosis not present

## 2018-08-30 DIAGNOSIS — M79601 Pain in right arm: Secondary | ICD-10-CM | POA: Diagnosis not present

## 2018-09-06 DIAGNOSIS — M79601 Pain in right arm: Secondary | ICD-10-CM | POA: Diagnosis not present

## 2018-09-13 DIAGNOSIS — M79601 Pain in right arm: Secondary | ICD-10-CM | POA: Diagnosis not present

## 2018-09-20 DIAGNOSIS — M79601 Pain in right arm: Secondary | ICD-10-CM | POA: Diagnosis not present

## 2018-10-04 DIAGNOSIS — M79601 Pain in right arm: Secondary | ICD-10-CM | POA: Diagnosis not present

## 2018-10-23 DIAGNOSIS — M79601 Pain in right arm: Secondary | ICD-10-CM | POA: Diagnosis not present

## 2018-11-16 DIAGNOSIS — M25429 Effusion, unspecified elbow: Secondary | ICD-10-CM | POA: Diagnosis not present

## 2018-11-16 DIAGNOSIS — R29898 Other symptoms and signs involving the musculoskeletal system: Secondary | ICD-10-CM | POA: Diagnosis not present

## 2018-11-16 DIAGNOSIS — M79601 Pain in right arm: Secondary | ICD-10-CM | POA: Diagnosis not present

## 2018-12-17 ENCOUNTER — Encounter: Payer: Self-pay | Admitting: Family Medicine

## 2018-12-17 ENCOUNTER — Ambulatory Visit (INDEPENDENT_AMBULATORY_CARE_PROVIDER_SITE_OTHER): Payer: BC Managed Care – PPO | Admitting: Family Medicine

## 2018-12-17 ENCOUNTER — Other Ambulatory Visit: Payer: Self-pay

## 2018-12-17 VITALS — BP 120/70 | HR 83 | Temp 98.2°F | Wt 220.6 lb

## 2018-12-17 DIAGNOSIS — Z0000 Encounter for general adult medical examination without abnormal findings: Secondary | ICD-10-CM | POA: Diagnosis not present

## 2018-12-17 DIAGNOSIS — M1000 Idiopathic gout, unspecified site: Secondary | ICD-10-CM | POA: Diagnosis not present

## 2018-12-17 LAB — LIPID PANEL
Cholesterol: 166 mg/dL (ref 0–200)
HDL: 31.4 mg/dL — ABNORMAL LOW (ref >39.00)
NonHDL: 134.48
Total CHOL/HDL Ratio: 5
Triglycerides: 266 mg/dL — ABNORMAL HIGH (ref 0.0–149.0)
VLDL: 53.2 mg/dL — ABNORMAL HIGH (ref 0.0–40.0)

## 2018-12-17 LAB — HEPATIC FUNCTION PANEL
ALT: 21 U/L (ref 0–53)
AST: 23 U/L (ref 0–37)
Albumin: 4.6 g/dL (ref 3.5–5.2)
Alkaline Phosphatase: 51 U/L (ref 39–117)
Bilirubin, Direct: 0.1 mg/dL (ref 0.0–0.3)
Total Bilirubin: 0.7 mg/dL (ref 0.2–1.2)
Total Protein: 6.7 g/dL (ref 6.0–8.3)

## 2018-12-17 LAB — POC URINALSYSI DIPSTICK (AUTOMATED)
Bilirubin, UA: NEGATIVE
Blood, UA: NEGATIVE
Glucose, UA: NEGATIVE
Ketones, UA: NEGATIVE
Leukocytes, UA: NEGATIVE
Nitrite, UA: NEGATIVE
Protein, UA: NEGATIVE
Spec Grav, UA: 1.02 (ref 1.010–1.025)
Urobilinogen, UA: 0.2 E.U./dL
pH, UA: 6.5 (ref 5.0–8.0)

## 2018-12-17 LAB — CBC WITH DIFFERENTIAL/PLATELET
Basophils Absolute: 0 10*3/uL (ref 0.0–0.1)
Basophils Relative: 0.5 % (ref 0.0–3.0)
Eosinophils Absolute: 0.2 10*3/uL (ref 0.0–0.7)
Eosinophils Relative: 2.9 % (ref 0.0–5.0)
HCT: 43.5 % (ref 39.0–52.0)
Hemoglobin: 15.2 g/dL (ref 13.0–17.0)
Lymphocytes Relative: 43.4 % (ref 12.0–46.0)
Lymphs Abs: 3.1 10*3/uL (ref 0.7–4.0)
MCHC: 34.9 g/dL (ref 30.0–36.0)
MCV: 90.2 fl (ref 78.0–100.0)
Monocytes Absolute: 0.4 10*3/uL (ref 0.1–1.0)
Monocytes Relative: 6 % (ref 3.0–12.0)
Neutro Abs: 3.3 10*3/uL (ref 1.4–7.7)
Neutrophils Relative %: 47.2 % (ref 43.0–77.0)
Platelets: 393 10*3/uL (ref 150.0–400.0)
RBC: 4.82 Mil/uL (ref 4.22–5.81)
RDW: 12.4 % (ref 11.5–15.5)
WBC: 7 10*3/uL (ref 4.0–10.5)

## 2018-12-17 LAB — TSH: TSH: 1.29 u[IU]/mL (ref 0.35–4.50)

## 2018-12-17 LAB — BASIC METABOLIC PANEL
BUN: 20 mg/dL (ref 6–23)
CO2: 30 mEq/L (ref 19–32)
Calcium: 9.8 mg/dL (ref 8.4–10.5)
Chloride: 101 mEq/L (ref 96–112)
Creatinine, Ser: 1.14 mg/dL (ref 0.40–1.50)
GFR: 67.35 mL/min (ref >60.00)
Glucose, Bld: 97 mg/dL (ref 70–99)
Potassium: 4.1 mEq/L (ref 3.5–5.1)
Sodium: 141 mEq/L (ref 135–145)

## 2018-12-17 LAB — LDL CHOLESTEROL, DIRECT: Direct LDL: 98 mg/dL

## 2018-12-17 LAB — PSA: PSA: 0.7 ng/mL (ref 0.10–4.00)

## 2018-12-17 LAB — URIC ACID: Uric Acid, Serum: 5.5 mg/dL (ref 4.0–7.8)

## 2018-12-17 NOTE — Progress Notes (Signed)
   Subjective:    Patient ID: Austin Hughes, male    DOB: 12-18-1965, 53 y.o.   MRN: [id number]  HPI Here for a well exam. He feels great.    Review of Systems  Constitutional: Negative.   HENT: Negative.   Eyes: Negative.   Respiratory: Negative.   Cardiovascular: Negative.   Gastrointestinal: Negative.   Genitourinary: Negative.   Musculoskeletal: Negative.   Skin: Negative.   Neurological: Negative.   Psychiatric/Behavioral: Negative.        Objective:   Physical Exam Constitutional:      General: He is not in acute distress.    Appearance: He is well-developed. He is not diaphoretic.  HENT:     Head: Normocephalic and atraumatic.     Right Ear: External ear normal.     Left Ear: External ear normal.     Nose: Nose normal.     Mouth/Throat:     Pharynx: No oropharyngeal exudate.  Eyes:     General: No scleral icterus.       Right eye: No discharge.        Left eye: No discharge.     Conjunctiva/sclera: Conjunctivae normal.     Pupils: Pupils are equal, round, and reactive to light.  Neck:     Musculoskeletal: Neck supple.     Thyroid: No thyromegaly.     Vascular: No JVD.     Trachea: No tracheal deviation.  Cardiovascular:     Rate and Rhythm: Normal rate and regular rhythm.     Heart sounds: Normal heart sounds. No murmur. No friction rub. No gallop.   Pulmonary:     Effort: Pulmonary effort is normal. No respiratory distress.     Breath sounds: Normal breath sounds. No wheezing or rales.  Chest:     Chest wall: No tenderness.  Abdominal:     General: Bowel sounds are normal. There is no distension.     Palpations: Abdomen is soft. There is no mass.     Tenderness: There is no abdominal tenderness. There is no guarding or rebound.  Genitourinary:    Penis: Normal. No tenderness.      Prostate: Normal.     Rectum: Normal. Guaiac result negative.  Musculoskeletal: Normal range of motion.        General: No tenderness.  Lymphadenopathy:     Cervical: No  cervical adenopathy.  Skin:    General: Skin is warm and dry.     Coloration: Skin is not pale.     Findings: No erythema or rash.  Neurological:     Mental Status: He is alert and oriented to person, place, and time.     Cranial Nerves: No cranial nerve deficit.     Motor: No abnormal muscle tone.     Coordination: Coordination normal.     Deep Tendon Reflexes: Reflexes are normal and symmetric. Reflexes normal.  Psychiatric:        Behavior: Behavior normal.        Thought Content: Thought content normal.        Judgment: Judgment normal.           Assessment & Plan:  Well exam. We discussed diet and exercise. Get fasting labs.  Alysia Penna, MD

## 2018-12-25 ENCOUNTER — Telehealth: Payer: Self-pay

## 2018-12-25 NOTE — Telephone Encounter (Signed)
Left message for patient to call back. CRM created 

## 2018-12-25 NOTE — Telephone Encounter (Signed)
Copied from [place] [id number]. Topic: General - Other >> Dec 20, 2018  3:38 PM Keene Breath wrote: Reason for CRM: Patient is calling to give some information to the nurse regarding his scripts that the doctor has to send in.  He said the doctor wanted his to call back with the information from [facility].  CB# [phone]

## 2018-12-26 MED ORDER — FENOFIBRATE 145 MG PO TABS: 145.0000 mg | ORAL_TABLET | Freq: Every day | ORAL | 3 refills | Status: DC

## 2018-12-26 MED ORDER — ALLOPURINOL 100 MG PO TABS: 100.0000 mg | ORAL_TABLET | Freq: Every day | ORAL | 3 refills | Status: DC

## 2018-12-26 MED ORDER — LOSARTAN POTASSIUM 100 MG PO TABS: 100.0000 mg | ORAL_TABLET | Freq: Every day | ORAL | 3 refills | Status: DC

## 2018-12-26 MED ORDER — ROSUVASTATIN CALCIUM 20 MG PO TABS: 20.0000 mg | ORAL_TABLET | Freq: Every day | ORAL | 3 refills | Status: DC

## 2018-12-26 NOTE — Telephone Encounter (Signed)
Pt called in just to request a refill on his medications and would like to have them sent to  [facility] that is on file. Pt would like a 90 day supply.    Medications: allopurinol (ZYLOPRIM) 100 MG tablet fenofibrate (TRICOR) 145 MG tablet losartan (COZAAR) 100 MG tablet  rosuvastatin (CRESTOR) 20 MG tablet

## 2018-12-26 NOTE — Telephone Encounter (Signed)
Prescriptions have been sent in. Patient is aware.  

## 2019-01-02 NOTE — Telephone Encounter (Signed)
This has been taking care of.

## 2019-01-28 DIAGNOSIS — Z20828 Contact with and (suspected) exposure to other viral communicable diseases: Secondary | ICD-10-CM | POA: Diagnosis not present

## 2019-01-28 DIAGNOSIS — Z1159 Encounter for screening for other viral diseases: Secondary | ICD-10-CM | POA: Diagnosis not present

## 2019-03-29 DIAGNOSIS — M25522 Pain in left elbow: Secondary | ICD-10-CM | POA: Diagnosis not present

## 2019-04-05 DIAGNOSIS — M25522 Pain in left elbow: Secondary | ICD-10-CM | POA: Diagnosis not present

## 2019-04-18 DIAGNOSIS — M79601 Pain in right arm: Secondary | ICD-10-CM | POA: Diagnosis not present

## 2019-04-18 DIAGNOSIS — M25522 Pain in left elbow: Secondary | ICD-10-CM | POA: Diagnosis not present

## 2019-05-02 DIAGNOSIS — M79601 Pain in right arm: Secondary | ICD-10-CM | POA: Diagnosis not present

## 2019-05-02 DIAGNOSIS — M25522 Pain in left elbow: Secondary | ICD-10-CM | POA: Diagnosis not present

## 2019-05-13 DIAGNOSIS — M25522 Pain in left elbow: Secondary | ICD-10-CM | POA: Diagnosis not present

## 2019-05-17 DIAGNOSIS — M25522 Pain in left elbow: Secondary | ICD-10-CM | POA: Diagnosis not present

## 2019-06-30 DIAGNOSIS — S0502XA Injury of conjunctiva and corneal abrasion without foreign body, left eye, initial encounter: Secondary | ICD-10-CM | POA: Diagnosis not present

## 2019-07-01 DIAGNOSIS — S0502XA Injury of conjunctiva and corneal abrasion without foreign body, left eye, initial encounter: Secondary | ICD-10-CM | POA: Diagnosis not present

## 2019-07-02 DIAGNOSIS — S0502XA Injury of conjunctiva and corneal abrasion without foreign body, left eye, initial encounter: Secondary | ICD-10-CM | POA: Diagnosis not present

## 2019-07-20 ENCOUNTER — Ambulatory Visit: Payer: BC Managed Care – PPO

## 2019-08-16 ENCOUNTER — Telehealth: Payer: Self-pay | Admitting: Family Medicine

## 2019-08-16 NOTE — Telephone Encounter (Signed)
Health History/Physical Form to be filled out- placed in dr's folder.  Call [phone] upon completion.

## 2019-08-20 DIAGNOSIS — Z0279 Encounter for issue of other medical certificate: Secondary | ICD-10-CM

## 2019-08-20 NOTE — Telephone Encounter (Signed)
The form is ready  

## 2019-08-20 NOTE — Telephone Encounter (Signed)
Patient has been called and notified. He will pick up the forms. Nothing further needed.

## 2019-08-23 NOTE — Telephone Encounter (Signed)
Pt came and picked up forms and a copy with charge sheet of $50.00 was given to [facility].

## 2019-10-01 ENCOUNTER — Telehealth: Payer: Self-pay | Admitting: Family Medicine

## 2019-10-01 MED ORDER — ALLOPURINOL 100 MG PO TABS: 100.0000 mg | ORAL_TABLET | Freq: Every day | ORAL | 3 refills | Status: DC

## 2019-10-01 MED ORDER — ROSUVASTATIN CALCIUM 20 MG PO TABS: 20.0000 mg | ORAL_TABLET | Freq: Every day | ORAL | 3 refills | Status: DC

## 2019-10-01 MED ORDER — FENOFIBRATE 145 MG PO TABS: 145.0000 mg | ORAL_TABLET | Freq: Every day | ORAL | 3 refills | Status: DC

## 2019-10-01 MED ORDER — LOSARTAN POTASSIUM 100 MG PO TABS: 100.0000 mg | ORAL_TABLET | Freq: Every day | ORAL | 3 refills | Status: DC

## 2019-10-01 NOTE — Telephone Encounter (Signed)
allopurinol (ZYLOPRIM) 100 MG tablet  fenofibrate (TRICOR) 145 MG tablet  losartan (COZAAR) 100 MG tablet rosuvastatin (CRESTOR) 20 MG tablet   Patient [facility]  Patient doesn't have phone number or address for Pharmacy

## 2019-10-01 NOTE — Telephone Encounter (Signed)
Prescriptions have been sent in. Spoke with the patient and he is aware.

## 2019-12-17 ENCOUNTER — Other Ambulatory Visit: Payer: Self-pay

## 2019-12-18 ENCOUNTER — Ambulatory Visit (INDEPENDENT_AMBULATORY_CARE_PROVIDER_SITE_OTHER): Payer: BC Managed Care – PPO | Admitting: Family Medicine

## 2019-12-18 ENCOUNTER — Encounter: Payer: Self-pay | Admitting: Family Medicine

## 2019-12-18 ENCOUNTER — Encounter: Payer: BC Managed Care – PPO | Admitting: Family Medicine

## 2019-12-18 VITALS — BP 120/70 | HR 79 | Ht 71.0 in | Wt 216.4 lb

## 2019-12-18 DIAGNOSIS — Z0000 Encounter for general adult medical examination without abnormal findings: Secondary | ICD-10-CM | POA: Diagnosis not present

## 2019-12-18 DIAGNOSIS — I10 Essential (primary) hypertension: Secondary | ICD-10-CM | POA: Diagnosis not present

## 2019-12-18 NOTE — Progress Notes (Signed)
   Subjective:    Patient ID: Austin Hughes, male    DOB: [birth year], 54 y.o.   MRN: [id number]  HPI Here for a well exam. He feels fine.    Review of Systems  Constitutional: Negative.   HENT: Negative.   Eyes: Negative.   Respiratory: Negative.   Cardiovascular: Negative.   Gastrointestinal: Negative.   Genitourinary: Negative.   Musculoskeletal: Negative.   Skin: Negative.   Neurological: Negative.   Psychiatric/Behavioral: Negative.        Objective:   Physical Exam Constitutional:      General: He is not in acute distress.    Appearance: He is well-developed. He is not diaphoretic.  HENT:     Head: Normocephalic and atraumatic.     Right Ear: External ear normal.     Left Ear: External ear normal.     Nose: Nose normal.     Mouth/Throat:     Pharynx: No oropharyngeal exudate.  Eyes:     General: No scleral icterus.       Right eye: No discharge.        Left eye: No discharge.     Conjunctiva/sclera: Conjunctivae normal.     Pupils: Pupils are equal, round, and reactive to light.  Neck:     Thyroid: No thyromegaly.     Vascular: No JVD.     Trachea: No tracheal deviation.  Cardiovascular:     Rate and Rhythm: Normal rate and regular rhythm.     Heart sounds: Normal heart sounds. No murmur heard.  No friction rub. No gallop.   Pulmonary:     Effort: Pulmonary effort is normal. No respiratory distress.     Breath sounds: Normal breath sounds. No wheezing or rales.  Chest:     Chest wall: No tenderness.  Abdominal:     General: Bowel sounds are normal. There is no distension.     Palpations: Abdomen is soft. There is no mass.     Tenderness: There is no abdominal tenderness. There is no guarding or rebound.  Genitourinary:    Penis: Normal. No tenderness.      Testes: Normal.     Prostate: Normal.     Rectum: Normal. Guaiac result negative.  Musculoskeletal:        General: No tenderness. Normal range of motion.     Cervical back: Neck supple.    Lymphadenopathy:     Cervical: No cervical adenopathy.  Skin:    General: Skin is warm and dry.     Coloration: Skin is not pale.     Findings: No erythema or rash.  Neurological:     Mental Status: He is alert and oriented to person, place, and time.     Cranial Nerves: No cranial nerve deficit.     Motor: No abnormal muscle tone.     Coordination: Coordination normal.     Deep Tendon Reflexes: Reflexes are normal and symmetric. Reflexes normal.  Psychiatric:        Behavior: Behavior normal.        Thought Content: Thought content normal.        Judgment: Judgment normal.           Assessment & Plan:  Well exam. We discussed diet and exercise. Get fasting labs.  Alysia Penna, MD

## 2019-12-19 LAB — CBC WITH DIFFERENTIAL/PLATELET
Absolute Monocytes: 442 cells/uL (ref 200–950)
Basophils Absolute: 52 cells/uL (ref 0–200)
Basophils Relative: 0.8 %
Eosinophils Absolute: 117 cells/uL (ref 15–500)
Eosinophils Relative: 1.8 %
HCT: 45.6 % (ref 38.5–50.0)
Hemoglobin: 15.8 g/dL (ref 13.2–17.1)
Lymphs Abs: 2724 cells/uL (ref 850–3900)
MCH: 31.9 pg (ref 27.0–33.0)
MCHC: 34.6 g/dL (ref 32.0–36.0)
MCV: 91.9 fL (ref 80.0–100.0)
MPV: 8.5 fL (ref 7.5–12.5)
Monocytes Relative: 6.8 %
Neutro Abs: 3166 cells/uL (ref 1500–7800)
Neutrophils Relative %: 48.7 %
Platelets: 390 10*3/uL (ref 140–400)
RBC: 4.96 10*6/uL (ref 4.20–5.80)
RDW: 12.2 % (ref 11.0–15.0)
Total Lymphocyte: 41.9 %
WBC: 6.5 10*3/uL (ref 3.8–10.8)

## 2019-12-19 LAB — LIPID PANEL
Cholesterol: 186 mg/dL (ref <200)
HDL: 37 mg/dL — ABNORMAL LOW (ref >=40)
LDL Cholesterol (Calc): 111 mg/dL (calc) — ABNORMAL HIGH
Non-HDL Cholesterol (Calc): 149 mg/dL (calc) — ABNORMAL HIGH (ref <130)
Total CHOL/HDL Ratio: 5 (calc) — ABNORMAL HIGH (ref <5.0)
Triglycerides: 266 mg/dL — ABNORMAL HIGH (ref <150)

## 2019-12-19 LAB — BASIC METABOLIC PANEL
BUN: 20 mg/dL (ref 7–25)
CO2: 28 mmol/L (ref 20–32)
Calcium: 10.2 mg/dL (ref 8.6–10.3)
Chloride: 98 mmol/L (ref 98–110)
Creat: 1.16 mg/dL (ref 0.70–1.33)
Glucose, Bld: 104 mg/dL — ABNORMAL HIGH (ref 65–99)
Potassium: 3.8 mmol/L (ref 3.5–5.3)
Sodium: 136 mmol/L (ref 135–146)

## 2019-12-19 LAB — HEPATIC FUNCTION PANEL
AG Ratio: 2.3 (calc) (ref 1.0–2.5)
ALT: 24 U/L (ref 9–46)
AST: 30 U/L (ref 10–35)
Albumin: 4.9 g/dL (ref 3.6–5.1)
Alkaline phosphatase (APISO): 53 U/L (ref 35–144)
Bilirubin, Direct: 0.1 mg/dL (ref 0.0–0.2)
Globulin: 2.1 g/dL (calc) (ref 1.9–3.7)
Indirect Bilirubin: 0.6 mg/dL (calc) (ref 0.2–1.2)
Total Bilirubin: 0.7 mg/dL (ref 0.2–1.2)
Total Protein: 7 g/dL (ref 6.1–8.1)

## 2019-12-19 LAB — PSA: PSA: 0.8 ng/mL (ref <=4.0)

## 2019-12-19 LAB — TSH: TSH: 1.45 mIU/L (ref 0.40–4.50)

## 2020-02-17 DIAGNOSIS — Z20822 Contact with and (suspected) exposure to covid-19: Secondary | ICD-10-CM | POA: Diagnosis not present

## 2020-02-17 DIAGNOSIS — R0981 Nasal congestion: Secondary | ICD-10-CM | POA: Diagnosis not present

## 2020-02-17 DIAGNOSIS — R059 Cough, unspecified: Secondary | ICD-10-CM | POA: Diagnosis not present

## 2020-02-17 DIAGNOSIS — J029 Acute pharyngitis, unspecified: Secondary | ICD-10-CM | POA: Diagnosis not present

## 2020-02-22 DIAGNOSIS — R059 Cough, unspecified: Secondary | ICD-10-CM | POA: Diagnosis not present

## 2020-02-22 DIAGNOSIS — Z20822 Contact with and (suspected) exposure to covid-19: Secondary | ICD-10-CM | POA: Diagnosis not present

## 2020-02-22 DIAGNOSIS — J029 Acute pharyngitis, unspecified: Secondary | ICD-10-CM | POA: Diagnosis not present

## 2020-03-11 ENCOUNTER — Ambulatory Visit: Payer: BC Managed Care – PPO | Admitting: Family Medicine

## 2020-03-11 ENCOUNTER — Other Ambulatory Visit: Payer: Self-pay

## 2020-03-11 ENCOUNTER — Encounter: Payer: Self-pay | Admitting: Family Medicine

## 2020-03-11 VITALS — BP 132/70 | HR 81 | Temp 99.0°F | Ht 71.0 in | Wt 226.2 lb

## 2020-03-11 DIAGNOSIS — B029 Zoster without complications: Secondary | ICD-10-CM | POA: Diagnosis not present

## 2020-03-11 MED ORDER — VALACYCLOVIR HCL 1 G PO TABS: 1000.0000 mg | ORAL_TABLET | Freq: Three times a day (TID) | ORAL | 0 refills | Status: DC

## 2020-03-11 MED ORDER — METHYLPREDNISOLONE 4 MG PO TBPK: ORAL_TABLET | ORAL | 0 refills | Status: DC

## 2020-03-11 NOTE — Progress Notes (Signed)
   Subjective:    Patient ID: Austin Hughes, male    DOB: June 24, 1965, 54 y.o.   MRN: [id number]  HPI Here for one week of burning pains in the right trunk which run from the right chest around the side to the right middle back. His skin is extra sensitive to touch on this area also. No SOB. No pain with moving his arm around.    Review of Systems  Constitutional: Negative.   Respiratory: Negative.   Cardiovascular: Positive for chest pain.       Objective:   Physical Exam Constitutional:      Appearance: Normal appearance.  Cardiovascular:     Rate and Rhythm: Normal rate and regular rhythm.     Pulses: Normal pulses.     Heart sounds: Normal heart sounds.  Pulmonary:     Effort: Pulmonary effort is normal.     Breath sounds: Normal breath sounds.  Skin:    Findings: No erythema or rash.     Comments: His skin is extra sensitive to touch in this area  Neurological:     Mental Status: He is alert.           Assessment & Plan:  This is most likely shingles. I advised him that he may well develop red blisters in this area in the next few days. Treat with Valtrex and a Medrol dose pack.  Alysia Penna, MD

## 2020-03-19 DIAGNOSIS — Z20822 Contact with and (suspected) exposure to covid-19: Secondary | ICD-10-CM | POA: Diagnosis not present

## 2020-11-23 ENCOUNTER — Other Ambulatory Visit: Payer: Self-pay | Admitting: Family Medicine

## 2020-12-18 ENCOUNTER — Ambulatory Visit (INDEPENDENT_AMBULATORY_CARE_PROVIDER_SITE_OTHER): Payer: BC Managed Care – PPO | Admitting: Family Medicine

## 2020-12-18 ENCOUNTER — Encounter: Payer: Self-pay | Admitting: Family Medicine

## 2020-12-18 ENCOUNTER — Other Ambulatory Visit: Payer: Self-pay

## 2020-12-18 VITALS — BP 120/80 | HR 78 | Temp 98.8°F | Ht 71.25 in | Wt 215.0 lb

## 2020-12-18 DIAGNOSIS — Z125 Encounter for screening for malignant neoplasm of prostate: Secondary | ICD-10-CM | POA: Diagnosis not present

## 2020-12-18 DIAGNOSIS — Z0000 Encounter for general adult medical examination without abnormal findings: Secondary | ICD-10-CM | POA: Diagnosis not present

## 2020-12-18 LAB — CBC WITH DIFFERENTIAL/PLATELET
Basophils Absolute: 0 10*3/uL (ref 0.0–0.1)
Basophils Relative: 0.5 % (ref 0.0–3.0)
Eosinophils Absolute: 0.1 10*3/uL (ref 0.0–0.7)
Eosinophils Relative: 1.2 % (ref 0.0–5.0)
HCT: 45.5 % (ref 39.0–52.0)
Hemoglobin: 15.6 g/dL (ref 13.0–17.0)
Lymphocytes Relative: 35.1 % (ref 12.0–46.0)
Lymphs Abs: 2.4 10*3/uL (ref 0.7–4.0)
MCHC: 34.2 g/dL (ref 30.0–36.0)
MCV: 91.8 fl (ref 78.0–100.0)
Monocytes Absolute: 0.5 10*3/uL (ref 0.1–1.0)
Monocytes Relative: 6.7 % (ref 3.0–12.0)
Neutro Abs: 3.8 10*3/uL (ref 1.4–7.7)
Neutrophils Relative %: 56.5 % (ref 43.0–77.0)
Platelets: 391 10*3/uL (ref 150.0–400.0)
RBC: 4.95 Mil/uL (ref 4.22–5.81)
RDW: 12.6 % (ref 11.5–15.5)
WBC: 6.7 10*3/uL (ref 4.0–10.5)

## 2020-12-18 LAB — HEPATIC FUNCTION PANEL
ALT: 23 U/L (ref 0–53)
AST: 26 U/L (ref 0–37)
Albumin: 4.6 g/dL (ref 3.5–5.2)
Alkaline Phosphatase: 47 U/L (ref 39–117)
Bilirubin, Direct: 0.1 mg/dL (ref 0.0–0.3)
Total Bilirubin: 0.7 mg/dL (ref 0.2–1.2)
Total Protein: 7 g/dL (ref 6.0–8.3)

## 2020-12-18 LAB — BASIC METABOLIC PANEL
BUN: 18 mg/dL (ref 6–23)
CO2: 28 mEq/L (ref 19–32)
Calcium: 10.2 mg/dL (ref 8.4–10.5)
Chloride: 99 mEq/L (ref 96–112)
Creatinine, Ser: 1.11 mg/dL (ref 0.40–1.50)
GFR: 75.34 mL/min (ref >60.00)
Glucose, Bld: 113 mg/dL — ABNORMAL HIGH (ref 70–99)
Potassium: 4.3 mEq/L (ref 3.5–5.1)
Sodium: 137 mEq/L (ref 135–145)

## 2020-12-18 LAB — LIPID PANEL
Cholesterol: 168 mg/dL (ref 0–200)
HDL: 36.3 mg/dL — ABNORMAL LOW (ref >39.00)
NonHDL: 132.09
Total CHOL/HDL Ratio: 5
Triglycerides: 216 mg/dL — ABNORMAL HIGH (ref 0.0–149.0)
VLDL: 43.2 mg/dL — ABNORMAL HIGH (ref 0.0–40.0)

## 2020-12-18 LAB — TSH: TSH: 1.52 u[IU]/mL (ref 0.35–5.50)

## 2020-12-18 LAB — LDL CHOLESTEROL, DIRECT: Direct LDL: 109 mg/dL

## 2020-12-18 LAB — HEMOGLOBIN A1C: Hgb A1c MFr Bld: 6.3 % (ref 4.6–6.5)

## 2020-12-18 LAB — PSA: PSA: 0.61 ng/mL (ref 0.10–4.00)

## 2020-12-18 NOTE — Addendum Note (Signed)
Addended by: Amanda Cockayne on: 12/18/2020 09:39 AM   Modules accepted: Orders

## 2020-12-18 NOTE — Progress Notes (Signed)
   Subjective:    Patient ID: Austin Hughes, male    DOB: 1965/04/22, 55 y.o.   MRN: [id number]  HPI Here for a well exam. He feels fine. He notes that his brother was recently diagnosed with pancreatic cancer.    Review of Systems  Constitutional: Negative.   HENT: Negative.    Eyes: Negative.   Respiratory: Negative.    Cardiovascular: Negative.   Gastrointestinal: Negative.   Genitourinary: Negative.   Musculoskeletal: Negative.   Skin: Negative.   Neurological: Negative.   Psychiatric/Behavioral: Negative.        Objective:   Physical Exam Constitutional:      General: He is not in acute distress.    Appearance: Normal appearance. He is well-developed. He is not diaphoretic.  HENT:     Head: Normocephalic and atraumatic.     Right Ear: External ear normal.     Left Ear: External ear normal.     Nose: Nose normal.     Mouth/Throat:     Pharynx: No oropharyngeal exudate.  Eyes:     General: No scleral icterus.       Right eye: No discharge.        Left eye: No discharge.     Conjunctiva/sclera: Conjunctivae normal.     Pupils: Pupils are equal, round, and reactive to light.  Neck:     Thyroid: No thyromegaly.     Vascular: No JVD.     Trachea: No tracheal deviation.  Cardiovascular:     Rate and Rhythm: Normal rate and regular rhythm.     Heart sounds: Normal heart sounds. No murmur heard.   No friction rub. No gallop.  Pulmonary:     Effort: Pulmonary effort is normal. No respiratory distress.     Breath sounds: Normal breath sounds. No wheezing or rales.  Chest:     Chest wall: No tenderness.  Abdominal:     General: Bowel sounds are normal. There is no distension.     Palpations: Abdomen is soft. There is no mass.     Tenderness: There is no abdominal tenderness. There is no guarding or rebound.  Genitourinary:    Penis: Normal. No tenderness.      Testes: Normal.     Prostate: Normal.     Rectum: Normal. Guaiac result negative.  Musculoskeletal:         General: No tenderness. Normal range of motion.     Cervical back: Neck supple.  Lymphadenopathy:     Cervical: No cervical adenopathy.  Skin:    General: Skin is warm and dry.     Coloration: Skin is not pale.     Findings: No erythema or rash.  Neurological:     Mental Status: He is alert and oriented to person, place, and time.     Cranial Nerves: No cranial nerve deficit.     Motor: No abnormal muscle tone.     Coordination: Coordination normal.     Deep Tendon Reflexes: Reflexes are normal and symmetric. Reflexes normal.  Psychiatric:        Behavior: Behavior normal.        Thought Content: Thought content normal.        Judgment: Judgment normal.          Assessment & Plan:  Well exam. We discussed diet and exercise. I reassured him that pancreatic cancer is not heritable. Get fasting labs. Alysia Penna, MD

## 2021-03-01 ENCOUNTER — Encounter: Payer: Self-pay | Admitting: Family Medicine

## 2021-03-01 ENCOUNTER — Ambulatory Visit: Payer: BC Managed Care – PPO | Admitting: Family Medicine

## 2021-03-01 VITALS — BP 126/74 | HR 67 | Temp 99.0°F | Wt 217.0 lb

## 2021-03-01 DIAGNOSIS — L089 Local infection of the skin and subcutaneous tissue, unspecified: Secondary | ICD-10-CM | POA: Diagnosis not present

## 2021-03-01 DIAGNOSIS — L723 Sebaceous cyst: Secondary | ICD-10-CM | POA: Diagnosis not present

## 2021-03-01 MED ORDER — DOXYCYCLINE HYCLATE 100 MG PO CAPS: 100.0000 mg | ORAL_CAPSULE | Freq: Two times a day (BID) | ORAL | 0 refills | Status: AC

## 2021-03-01 NOTE — Progress Notes (Signed)
   Subjective:    Patient ID: Austin Hughes, male    DOB: 21-Sep-1965, 55 y.o.   MRN: [id number]  HPI Here for a tender lump on the back. The lump appeared about 3 months ago but it never bothered him until a few days ago. Since then it has been painful. No drainage.    Review of Systems  Constitutional: Negative.   Respiratory: Negative.    Cardiovascular: Negative.       Objective:   Physical Exam Constitutional:      General: He is not in acute distress.    Appearance: Normal appearance.  Cardiovascular:     Rate and Rhythm: Normal rate and regular rhythm.     Pulses: Normal pulses.     Heart sounds: Normal heart sounds.  Pulmonary:     Effort: Pulmonary effort is normal.     Breath sounds: Normal breath sounds.  Skin:    Comments: There is a small inflamed sebaceous cyst in the right lower back   Neurological:     Mental Status: He is alert.          Assessment & Plan:  Inflamed sebaceous cyst, treat with Doxycycline. Recheck as needed.  Alysia Penna, MD

## 2021-11-30 DIAGNOSIS — J069 Acute upper respiratory infection, unspecified: Secondary | ICD-10-CM | POA: Diagnosis not present

## 2021-11-30 DIAGNOSIS — R059 Cough, unspecified: Secondary | ICD-10-CM | POA: Diagnosis not present

## 2021-12-13 DIAGNOSIS — J019 Acute sinusitis, unspecified: Secondary | ICD-10-CM | POA: Diagnosis not present

## 2021-12-13 DIAGNOSIS — B9689 Other specified bacterial agents as the cause of diseases classified elsewhere: Secondary | ICD-10-CM | POA: Diagnosis not present

## 2021-12-13 DIAGNOSIS — J209 Acute bronchitis, unspecified: Secondary | ICD-10-CM | POA: Diagnosis not present

## 2021-12-21 ENCOUNTER — Encounter: Payer: Self-pay | Admitting: Family Medicine

## 2021-12-21 ENCOUNTER — Ambulatory Visit (INDEPENDENT_AMBULATORY_CARE_PROVIDER_SITE_OTHER): Payer: BC Managed Care – PPO | Admitting: Family Medicine

## 2021-12-21 VITALS — BP 130/80 | HR 72 | Temp 98.5°F | Ht 71.0 in | Wt 213.4 lb

## 2021-12-21 DIAGNOSIS — Z0000 Encounter for general adult medical examination without abnormal findings: Secondary | ICD-10-CM

## 2021-12-21 DIAGNOSIS — Z136 Encounter for screening for cardiovascular disorders: Secondary | ICD-10-CM

## 2021-12-21 DIAGNOSIS — M1000 Idiopathic gout, unspecified site: Secondary | ICD-10-CM | POA: Diagnosis not present

## 2021-12-21 LAB — HEPATIC FUNCTION PANEL
ALT: 25 U/L (ref 0–53)
AST: 26 U/L (ref 0–37)
Albumin: 4.5 g/dL (ref 3.5–5.2)
Alkaline Phosphatase: 56 U/L (ref 39–117)
Bilirubin, Direct: 0.1 mg/dL (ref 0.0–0.3)
Total Bilirubin: 0.7 mg/dL (ref 0.2–1.2)
Total Protein: 7 g/dL (ref 6.0–8.3)

## 2021-12-21 LAB — BASIC METABOLIC PANEL
BUN: 24 mg/dL — ABNORMAL HIGH (ref 6–23)
CO2: 28 mEq/L (ref 19–32)
Calcium: 10.5 mg/dL (ref 8.4–10.5)
Chloride: 101 mEq/L (ref 96–112)
Creatinine, Ser: 1.46 mg/dL (ref 0.40–1.50)
GFR: 53.84 mL/min — ABNORMAL LOW (ref >60.00)
Glucose, Bld: 126 mg/dL — ABNORMAL HIGH (ref 70–99)
Potassium: 4.4 mEq/L (ref 3.5–5.1)
Sodium: 140 mEq/L (ref 135–145)

## 2021-12-21 LAB — CBC WITH DIFFERENTIAL/PLATELET
Basophils Absolute: 0 10*3/uL (ref 0.0–0.1)
Basophils Relative: 0.8 % (ref 0.0–3.0)
Eosinophils Absolute: 0.1 10*3/uL (ref 0.0–0.7)
Eosinophils Relative: 2.3 % (ref 0.0–5.0)
HCT: 43 % (ref 39.0–52.0)
Hemoglobin: 14.8 g/dL (ref 13.0–17.0)
Lymphocytes Relative: 40.7 % (ref 12.0–46.0)
Lymphs Abs: 2.4 10*3/uL (ref 0.7–4.0)
MCHC: 34.4 g/dL (ref 30.0–36.0)
MCV: 92.3 fl (ref 78.0–100.0)
Monocytes Absolute: 0.4 10*3/uL (ref 0.1–1.0)
Monocytes Relative: 7.3 % (ref 3.0–12.0)
Neutro Abs: 2.9 10*3/uL (ref 1.4–7.7)
Neutrophils Relative %: 48.9 % (ref 43.0–77.0)
Platelets: 400 10*3/uL (ref 150.0–400.0)
RBC: 4.66 Mil/uL (ref 4.22–5.81)
RDW: 12.7 % (ref 11.5–15.5)
WBC: 6 10*3/uL (ref 4.0–10.5)

## 2021-12-21 LAB — URIC ACID: Uric Acid, Serum: 6.7 mg/dL (ref 4.0–7.8)

## 2021-12-21 LAB — LIPID PANEL
Cholesterol: 174 mg/dL (ref 0–200)
HDL: 32.9 mg/dL — ABNORMAL LOW (ref >39.00)
NonHDL: 141.25
Total CHOL/HDL Ratio: 5
Triglycerides: 262 mg/dL — ABNORMAL HIGH (ref 0.0–149.0)
VLDL: 52.4 mg/dL — ABNORMAL HIGH (ref 0.0–40.0)

## 2021-12-21 LAB — PSA: PSA: 0.84 ng/mL (ref 0.10–4.00)

## 2021-12-21 LAB — TSH: TSH: 0.99 u[IU]/mL (ref 0.35–5.50)

## 2021-12-21 LAB — LDL CHOLESTEROL, DIRECT: Direct LDL: 105 mg/dL

## 2021-12-21 LAB — HEMOGLOBIN A1C: Hgb A1c MFr Bld: 6.6 % — ABNORMAL HIGH (ref 4.6–6.5)

## 2021-12-21 MED ORDER — ALLOPURINOL 100 MG PO TABS: 100.0000 mg | ORAL_TABLET | Freq: Every day | ORAL | 3 refills | Status: DC

## 2021-12-21 MED ORDER — FENOFIBRATE 145 MG PO TABS: 145.0000 mg | ORAL_TABLET | Freq: Every day | ORAL | 3 refills | Status: DC

## 2021-12-21 MED ORDER — ROSUVASTATIN CALCIUM 20 MG PO TABS: 20.0000 mg | ORAL_TABLET | Freq: Every day | ORAL | 3 refills | Status: DC

## 2021-12-21 MED ORDER — LOSARTAN POTASSIUM 100 MG PO TABS: 100.0000 mg | ORAL_TABLET | Freq: Every day | ORAL | 3 refills | Status: DC

## 2021-12-21 NOTE — Progress Notes (Signed)
   Subjective:    Patient ID: Austin Hughes, male    DOB: 20-Nov-1965, 56 y.o.   MRN: [id number]  HPI Here for a well exam. He feels fine. He has not had a gout attack for years.    Review of Systems  Constitutional: Negative.   HENT: Negative.    Eyes: Negative.   Respiratory: Negative.    Cardiovascular: Negative.   Gastrointestinal: Negative.   Genitourinary: Negative.   Musculoskeletal: Negative.   Skin: Negative.   Neurological: Negative.   Psychiatric/Behavioral: Negative.         Objective:   Physical Exam Constitutional:      General: He is not in acute distress.    Appearance: Normal appearance. He is well-developed. He is not diaphoretic.  HENT:     Head: Normocephalic and atraumatic.     Right Ear: External ear normal.     Left Ear: External ear normal.     Nose: Nose normal.     Mouth/Throat:     Pharynx: No oropharyngeal exudate.  Eyes:     General: No scleral icterus.       Right eye: No discharge.        Left eye: No discharge.     Conjunctiva/sclera: Conjunctivae normal.     Pupils: Pupils are equal, round, and reactive to light.  Neck:     Thyroid: No thyromegaly.     Vascular: No JVD.     Trachea: No tracheal deviation.  Cardiovascular:     Rate and Rhythm: Normal rate and regular rhythm.     Heart sounds: Normal heart sounds. No murmur heard.    No friction rub. No gallop.  Pulmonary:     Effort: Pulmonary effort is normal. No respiratory distress.     Breath sounds: Normal breath sounds. No wheezing or rales.  Chest:     Chest wall: No tenderness.  Abdominal:     General: Bowel sounds are normal. There is no distension.     Palpations: Abdomen is soft. There is no mass.     Tenderness: There is no abdominal tenderness. There is no guarding or rebound.  Genitourinary:    Penis: Normal. No tenderness.      Testes: Normal.     Prostate: Normal.     Rectum: Normal. Guaiac result negative.  Musculoskeletal:        General: No tenderness.  Normal range of motion.     Cervical back: Neck supple.  Lymphadenopathy:     Cervical: No cervical adenopathy.  Skin:    General: Skin is warm and dry.     Coloration: Skin is not pale.     Findings: No erythema or rash.  Neurological:     Mental Status: He is alert and oriented to person, place, and time.     Cranial Nerves: No cranial nerve deficit.     Motor: No abnormal muscle tone.     Coordination: Coordination normal.     Deep Tendon Reflexes: Reflexes are normal and symmetric. Reflexes normal.  Psychiatric:        Behavior: Behavior normal.        Thought Content: Thought content normal.        Judgment: Judgment normal.           Assessment & Plan:  Well exam. We discussed diet and exercise. Get fasting labs. Alysia Penna, MD

## 2021-12-27 ENCOUNTER — Telehealth: Payer: Self-pay | Admitting: Family Medicine

## 2021-12-27 ENCOUNTER — Other Ambulatory Visit: Payer: Self-pay

## 2021-12-27 DIAGNOSIS — R739 Hyperglycemia, unspecified: Secondary | ICD-10-CM

## 2021-12-28 NOTE — Telephone Encounter (Signed)
error 

## 2022-03-16 DIAGNOSIS — M25522 Pain in left elbow: Secondary | ICD-10-CM | POA: Diagnosis not present

## 2022-03-16 DIAGNOSIS — M25511 Pain in right shoulder: Secondary | ICD-10-CM | POA: Diagnosis not present

## 2022-03-28 ENCOUNTER — Other Ambulatory Visit: Payer: BC Managed Care – PPO

## 2022-03-28 DIAGNOSIS — R739 Hyperglycemia, unspecified: Secondary | ICD-10-CM

## 2022-03-28 LAB — HEMOGLOBIN A1C: Hgb A1c MFr Bld: 6.6 % — ABNORMAL HIGH (ref 4.6–6.5)

## 2022-04-17 DIAGNOSIS — J09X2 Influenza due to identified novel influenza A virus with other respiratory manifestations: Secondary | ICD-10-CM | POA: Diagnosis not present

## 2022-04-19 DIAGNOSIS — I10 Essential (primary) hypertension: Secondary | ICD-10-CM | POA: Diagnosis not present

## 2022-04-19 DIAGNOSIS — J018 Other acute sinusitis: Secondary | ICD-10-CM | POA: Diagnosis not present

## 2022-04-19 DIAGNOSIS — J029 Acute pharyngitis, unspecified: Secondary | ICD-10-CM | POA: Diagnosis not present

## 2022-07-22 ENCOUNTER — Encounter: Payer: Self-pay | Admitting: Gastroenterology

## 2022-09-15 ENCOUNTER — Encounter: Payer: Self-pay | Admitting: Gastroenterology

## 2022-11-08 ENCOUNTER — Encounter: Payer: Self-pay | Admitting: Gastroenterology

## 2022-11-08 ENCOUNTER — Ambulatory Visit (AMBULATORY_SURGERY_CENTER): Payer: BC Managed Care – PPO

## 2022-11-08 VITALS — Ht 71.0 in | Wt 215.0 lb

## 2022-11-08 DIAGNOSIS — Z86010 Personal history of colonic polyps: Secondary | ICD-10-CM

## 2022-11-08 MED ORDER — NA SULFATE-K SULFATE-MG SULF 17.5-3.13-1.6 GM/177ML PO SOLN: 1.0000 | Freq: Once | ORAL | 0 refills | Status: AC

## 2022-11-08 NOTE — Progress Notes (Signed)
Pre visit completed via phone call; Patient verified name, DOB, and address;  No egg or soy allergy known to patient;  No issues known to pt with past sedation with any surgeries or procedures; Patient denies ever being told they had issues or difficulty with intubation;  No FH of Malignant Hyperthermia; Pt is not on diet pills; Pt is not on home 02;  Pt is not on blood thinners;  Pt denies issues with constipation;  No A fib or A flutter; Have any cardiac testing pending--NO Pt instructed to use Single[employer]com or GoodRx for a price reduction on prep;  Patient is able to ambulate without assistance; Insurance verified during PV appt=[employer]  Patient's chart reviewed by Cathlyn Parsons CNRA prior to previsit and patient appropriate for the [facility].  Previsit completed and red dot placed by patient's name on their procedure day (on provider's schedule).    Instructions sent to patient via MyChart per his request; GoodRx info sent in RX;

## 2022-11-25 ENCOUNTER — Encounter: Payer: Self-pay | Admitting: Gastroenterology

## 2022-11-25 ENCOUNTER — Ambulatory Visit (AMBULATORY_SURGERY_CENTER): Payer: BC Managed Care – PPO | Admitting: Gastroenterology

## 2022-11-25 VITALS — BP 100/77 | HR 70 | Temp 98.6°F | Resp 14 | Ht 71.0 in | Wt 215.0 lb

## 2022-11-25 DIAGNOSIS — Z86010 Personal history of colonic polyps: Secondary | ICD-10-CM | POA: Diagnosis not present

## 2022-11-25 DIAGNOSIS — D123 Benign neoplasm of transverse colon: Secondary | ICD-10-CM

## 2022-11-25 DIAGNOSIS — Z09 Encounter for follow-up examination after completed treatment for conditions other than malignant neoplasm: Secondary | ICD-10-CM

## 2022-11-25 DIAGNOSIS — Z1211 Encounter for screening for malignant neoplasm of colon: Secondary | ICD-10-CM | POA: Diagnosis not present

## 2022-11-25 DIAGNOSIS — K635 Polyp of colon: Secondary | ICD-10-CM | POA: Diagnosis not present

## 2022-11-25 HISTORY — PX: COLONOSCOPY: SHX174

## 2022-11-25 MED ORDER — SODIUM CHLORIDE 0.9 % IV SOLN: 500.0000 mL | Freq: Once | INTRAVENOUS | Status: DC

## 2022-11-25 NOTE — Progress Notes (Unsigned)
Called to room to assist during endoscopic procedure.  Patient ID and intended procedure confirmed with present staff. Received instructions for my participation in the procedure from the performing physician.  

## 2022-11-25 NOTE — Progress Notes (Unsigned)
GASTROENTEROLOGY PROCEDURE H&P NOTE   Primary Care Physician: Nelwyn Salisbury, MD  HPI: Austin Hughes is a 57 y.o. male who presents for Colonoscopy for surveillance with prior SSPs.  Past Medical History:  Diagnosis Date   Gout    Hyperlipidemia    on meds   Hypertension    on meds   MVP (mitral valve prolapse)    Seasonal allergies    Past Surgical History:  Procedure Laterality Date   BICEPS TENDON REPAIR Right 2020   COLONOSCOPY  05/29/2017   DJ-MAC-goly(exc)-SSP, 5 yr recall   SEPTOPLASTY     WISDOM TOOTH EXTRACTION     Current Outpatient Medications  Medication Sig Dispense Refill   allopurinol (ZYLOPRIM) 100 MG tablet Take 1 tablet (100 mg total) by mouth daily. 90 tablet 3   fenofibrate (TRICOR) 145 MG tablet Take 1 tablet (145 mg total) by mouth daily. 90 tablet 3   fexofenadine (ALLEGRA) 180 MG tablet Take 180 mg by mouth daily.     fish oil-omega-3 fatty acids 1000 MG capsule Take 2 g by mouth daily.      losartan (COZAAR) 100 MG tablet Take 1 tablet (100 mg total) by mouth daily. 90 tablet 3   rosuvastatin (CRESTOR) 20 MG tablet Take 1 tablet (20 mg total) by mouth daily. 90 tablet 3   Current Facility-Administered Medications  Medication Dose Route Frequency Provider Last Rate Last Admin   0.9 %  sodium chloride infusion  500 mL Intravenous Once Mansouraty, Netty Starring., MD        Current Outpatient Medications:    allopurinol (ZYLOPRIM) 100 MG tablet, Take 1 tablet (100 mg total) by mouth daily., Disp: 90 tablet, Rfl: 3   fenofibrate (TRICOR) 145 MG tablet, Take 1 tablet (145 mg total) by mouth daily., Disp: 90 tablet, Rfl: 3   fexofenadine (ALLEGRA) 180 MG tablet, Take 180 mg by mouth daily., Disp: , Rfl:    fish oil-omega-3 fatty acids 1000 MG capsule, Take 2 g by mouth daily. , Disp: , Rfl:    losartan (COZAAR) 100 MG tablet, Take 1 tablet (100 mg total) by mouth daily., Disp: 90 tablet, Rfl: 3   rosuvastatin (CRESTOR) 20 MG tablet, Take 1 tablet (20 mg  total) by mouth daily., Disp: 90 tablet, Rfl: 3  Current Facility-Administered Medications:    0.9 %  sodium chloride infusion, 500 mL, Intravenous, Once, Mansouraty, Netty Starring., MD No Known Allergies Family History  Problem Relation Age of Onset   Breast cancer Other    Diabetes Other    Hyperlipidemia Other    Colon cancer Neg Hx    Colon polyps Neg Hx    Esophageal cancer Neg Hx    Rectal cancer Neg Hx    Stomach cancer Neg Hx    Social History   Socioeconomic History   Marital status: Married    Spouse name: Not on file   Number of children: Not on file   Years of education: Not on file   Highest education level: Not on file  Occupational History   Not on file  Tobacco Use   Smoking status: Never   Smokeless tobacco: Never  Vaping Use   Vaping status: Never Used  Substance and Sexual Activity   Alcohol use: Yes    Alcohol/week: 3.0 standard drinks of alcohol    Types: 3 Standard drinks or equivalent per week    Comment: rare   Drug use: No   Sexual activity: Not on file  Other Topics Concern   Not on file  Social History Narrative   Not on file   Social Determinants of Health   Financial Resource Strain: Not on file  Food Insecurity: Not on file  Transportation Needs: Not on file  Physical Activity: Not on file  Stress: Not on file  Social Connections: Unknown (08/28/2021)   Received from [facility]   Social Network    Social Network: Not on file  Intimate Partner Violence: Unknown (07/20/2021)   Received from [employer], [facility]   HITS    Physically Hurt: Not on file    Insult or Talk Down To: Not on file    Threaten Physical Harm: Not on file    Scream or Curse: Not on file    Physical Exam: Today's Vitals   11/25/22 0805 11/25/22 0807  BP: 133/73   Pulse: 82   Temp: 98.6 F (37 C) 98.6 F (37 C)  SpO2: 96%   Weight: 215 lb (97.5 kg)   Height: 5\' 11"  (1.803 m)    Body mass index is 29.99 kg/m. GEN: NAD EYE:  Sclerae anicteric ENT: MMM CV: Non-tachycardic GI: Soft, NT/ND NEURO:  Alert & Oriented x 3  Lab Results: No results for input(s): "WBC", "HGB", "HCT", "PLT" in the last 72 hours. BMET No results for input(s): "NA", "K", "CL", "CO2", "GLUCOSE", "BUN", "CREATININE", "CALCIUM" in the last 72 hours. LFT No results for input(s): "PROT", "ALBUMIN", "AST", "ALT", "ALKPHOS", "BILITOT", "BILIDIR", "IBILI" in the last 72 hours. PT/INR No results for input(s): "LABPROT", "INR" in the last 72 hours.   Impression / Plan: This is a 57 y.o.male who presents for Colonoscopy for surveillance with prior SSPs.  The risks and benefits of endoscopic evaluation/treatment were discussed with the patient and/or family; these include but are not limited to the risk of perforation, infection, bleeding, missed lesions, lack of diagnosis, severe illness requiring hospitalization, as well as anesthesia and sedation related illnesses.  The patient's history has been reviewed, patient examined, no change in status, and deemed stable for procedure.  The patient and/or family is agreeable to proceed.    Corliss Parish, MD [facility] Office # [phone]

## 2022-11-25 NOTE — Progress Notes (Unsigned)
Pt's states no medical or surgical changes since previsit or office visit. 

## 2022-11-25 NOTE — Op Note (Signed)
[facility] Patient Name: Austin Hughes Procedure Date: 11/25/2022 8:16 AM MRN: [id number] Endoscopist: Corliss Parish , MD, [id number] Age: 57 Referring MD:  Date of Birth: [birth year] Gender: Male Account #: [id number] Procedure:                Colonoscopy Indications:              High risk colon cancer surveillance: Personal                            history of colonic polyps, High risk colon cancer                            surveillance: Personal history of sessile serrated                            colon polyp (less than 10 mm in size) with no                            dysplasia Medicines:                Monitored Anesthesia Care Procedure:                Pre-Anesthesia Assessment:                           - Prior to the procedure, a History and Physical                            was performed, and patient medications and                            allergies were reviewed. The patient's tolerance of                            previous anesthesia was also reviewed. The risks                            and benefits of the procedure and the sedation                            options and risks were discussed with the patient.                            All questions were answered, and informed consent                            was obtained. Prior Anticoagulants: The patient has                            taken no anticoagulant or antiplatelet agents. ASA                            Grade Assessment: II - A patient with mild systemic  disease. After reviewing the risks and benefits,                            the patient was deemed in satisfactory condition to                            undergo the procedure.                           After obtaining informed consent, the colonoscope                            was passed under direct vision. Throughout the                            procedure, the patient's blood pressure, pulse, and                             oxygen saturations were monitored continuously. The                            Olympus CF-HQ190L ([id number]) Colonoscope was                            introduced through the anus and advanced to the 3                            cm into the ileum. The colonoscopy was performed                            without difficulty. The patient tolerated the                            procedure. The quality of the bowel preparation was                            good. The terminal ileum, ileocecal valve,                            appendiceal orifice, and rectum were photographed. Scope In: 8:33:30 AM Scope Out: 8:46:16 AM Scope Withdrawal Time: 0 hours 10 minutes 4 seconds  Total Procedure Duration: 0 hours 12 minutes 46 seconds  Findings:                 The digital rectal exam findings include                            hemorrhoids. Pertinent negatives include no                            palpable rectal lesions.                           The terminal ileum and ileocecal valve appeared  normal.                           A single small-mouthed diverticulum was found in                            the ascending colon.                           A 6 mm polyp was found in the hepatic flexure. The                            polyp was sessile. The polyp was removed with a                            cold snare. Resection and retrieval were complete.                           Normal mucosa was found in the entire colon                            otherwise.                           Non-bleeding non-thrombosed external and internal                            hemorrhoids were found during retroflexion, during                            perianal exam and during digital exam. The                            hemorrhoids were Grade II (internal hemorrhoids                            that prolapse but reduce spontaneously). Complications:            No immediate  complications. Estimated Blood Loss:     Estimated blood loss was minimal. Impression:               - Hemorrhoids found on digital rectal exam.                           - The examined portion of the ileum was normal.                           - Diverticulosis in the ascending colon.                           - One 6 mm polyp at the hepatic flexure, removed                            with a cold snare. Resected and retrieved.                           -  Normal mucosa in the entire examined colon                            otherwise.                           - Non-bleeding non-thrombosed external and internal                            hemorrhoids. Recommendation:           - The patient will be observed post-procedure,                            until all discharge criteria are met.                           - Discharge patient to home.                           - Patient has a contact number available for                            emergencies. The signs and symptoms of potential                            delayed complications were discussed with the                            patient. Return to normal activities tomorrow.                            Written discharge instructions were provided to the                            patient.                           - High fiber diet.                           - Use FiberCon 1-2 tablets PO daily.                           - Continue present medications.                           - Await pathology results.                           - Repeat colonoscopy in 5-7 years for surveillance                            based on final pathology.                           - The findings and recommendations were discussed  with the patient.                           - The findings and recommendations were discussed                            with the patient's family. Corliss Parish, MD 11/25/2022 8:50:34 AM

## 2022-11-25 NOTE — Progress Notes (Unsigned)
Sedate, gd SR, tolerated procedure well, VSS, report to RN 

## 2022-11-25 NOTE — Patient Instructions (Addendum)
- High fiber diet.                           - Use FiberCon 1-2 tablets PO daily.                           - Continue present medications.                           - 1 polyp removed and sent to pathology. Hemorrhoids.                            - Await pathology results.                           - Repeat colonoscopy in 5-7 years for surveillance                            based on final pathology.                           - The findings and recommendations were discussed                            with the patient.                           - The findings and recommendations were discussed                            with the patient's family.  YOU HAD AN ENDOSCOPIC PROCEDURE TODAY AT THE [facility]:   Refer to the procedure report that was given to you for any specific questions about what was found during the examination.  If the procedure report does not answer your questions, please call your gastroenterologist to clarify.  If you requested that your care partner not be given the details of your procedure findings, then the procedure report has been included in a sealed envelope for you to review at your convenience later.  YOU SHOULD EXPECT: Some feelings of bloating in the abdomen. Passage of more gas than usual.  Walking can help get rid of the air that was put into your GI tract during the procedure and reduce the bloating. If you had a lower endoscopy (such as a colonoscopy or flexible sigmoidoscopy) you may notice spotting of blood in your stool or on the toilet paper. If you underwent a bowel prep for your procedure, you may not have a normal bowel movement for a few days.  Please Note:  You might notice some irritation and congestion in your nose or some drainage.  This is from the oxygen used during your procedure.  There is no need for concern and it should clear up in a day or so.  SYMPTOMS TO REPORT IMMEDIATELY:  Following lower endoscopy  (colonoscopy or flexible sigmoidoscopy):  Excessive amounts of blood in the stool  Significant tenderness or worsening of abdominal pains  Swelling of the abdomen that is new, acute  Fever of 100F or higher   For urgent or emergent issues, a gastroenterologist can be  reached at any hour by calling [phone]. Do not use MyChart messaging for urgent concerns.    DIET:  We do recommend a small meal at first, but then you may proceed to your regular diet.  Drink plenty of fluids but you should avoid alcoholic beverages for 24 hours.  ACTIVITY:  You should plan to take it easy for the rest of today and you should NOT DRIVE or use heavy machinery until tomorrow (because of the sedation medicines used during the test).    FOLLOW UP: Our staff will call the number listed on your records the next business day following your procedure.  We will call around 7:15- 8:00 am to check on you and address any questions or concerns that you may have regarding the information given to you following your procedure. If we do not reach you, we will leave a message.     If any biopsies were taken you will be contacted by phone or by letter within the next 1-3 weeks.  Please call us at [phone] if you have not heard about the biopsies in 3 weeks.    SIGNATURES/CONFIDENTIALITY: You and/or your care partner have signed paperwork which will be entered into your electronic medical record.  These signatures attest to the fact that that the information above on your After Visit Summary has been reviewed and is understood.  Full responsibility of the confidentiality of this discharge information lies with you and/or your care-partner.

## 2022-11-29 ENCOUNTER — Telehealth: Payer: Self-pay

## 2022-11-29 NOTE — Telephone Encounter (Signed)
Post procedure follow up call, no answer 

## 2022-12-01 ENCOUNTER — Encounter (INDEPENDENT_AMBULATORY_CARE_PROVIDER_SITE_OTHER): Payer: Self-pay

## 2022-12-02 ENCOUNTER — Encounter: Payer: Self-pay | Admitting: Gastroenterology

## 2022-12-23 ENCOUNTER — Encounter: Payer: Self-pay | Admitting: Family Medicine

## 2022-12-23 ENCOUNTER — Ambulatory Visit (INDEPENDENT_AMBULATORY_CARE_PROVIDER_SITE_OTHER): Payer: BC Managed Care – PPO | Admitting: Family Medicine

## 2022-12-23 VITALS — BP 124/80 | HR 72 | Temp 97.9°F | Ht 71.0 in | Wt 209.0 lb

## 2022-12-23 DIAGNOSIS — Z0000 Encounter for general adult medical examination without abnormal findings: Secondary | ICD-10-CM | POA: Diagnosis not present

## 2022-12-23 DIAGNOSIS — Z23 Encounter for immunization: Secondary | ICD-10-CM

## 2022-12-23 DIAGNOSIS — E119 Type 2 diabetes mellitus without complications: Secondary | ICD-10-CM | POA: Insufficient documentation

## 2022-12-23 LAB — LIPID PANEL
Cholesterol: 141 mg/dL (ref 0–200)
HDL: 35.2 mg/dL — ABNORMAL LOW (ref >39.00)
LDL Cholesterol: 53 mg/dL (ref 0–99)
NonHDL: 106.07
Total CHOL/HDL Ratio: 4
Triglycerides: 267 mg/dL — ABNORMAL HIGH (ref 0.0–149.0)
VLDL: 53.4 mg/dL — ABNORMAL HIGH (ref 0.0–40.0)

## 2022-12-23 LAB — BASIC METABOLIC PANEL
BUN: 18 mg/dL (ref 6–23)
CO2: 28 meq/L (ref 19–32)
Calcium: 10.1 mg/dL (ref 8.4–10.5)
Chloride: 100 mEq/L (ref 96–112)
Creatinine, Ser: 1.28 mg/dL (ref 0.40–1.50)
GFR: 62.61 mL/min (ref >60.00)
Glucose, Bld: 89 mg/dL (ref 70–99)
Potassium: 4.2 meq/L (ref 3.5–5.1)
Sodium: 138 meq/L (ref 135–145)

## 2022-12-23 LAB — CBC WITH DIFFERENTIAL/PLATELET
Basophils Absolute: 0 10*3/uL (ref 0.0–0.1)
Basophils Relative: 0.8 % (ref 0.0–3.0)
Eosinophils Absolute: 0.2 10*3/uL (ref 0.0–0.7)
Eosinophils Relative: 3.2 % (ref 0.0–5.0)
HCT: 48.7 % (ref 39.0–52.0)
Hemoglobin: 16.3 g/dL (ref 13.0–17.0)
Lymphocytes Relative: 46.9 % — ABNORMAL HIGH (ref 12.0–46.0)
Lymphs Abs: 2.8 10*3/uL (ref 0.7–4.0)
MCHC: 33.5 g/dL (ref 30.0–36.0)
MCV: 91.7 fl (ref 78.0–100.0)
Monocytes Absolute: 0.5 10*3/uL (ref 0.1–1.0)
Monocytes Relative: 7.8 % (ref 3.0–12.0)
Neutro Abs: 2.4 10*3/uL (ref 1.4–7.7)
Neutrophils Relative %: 41.3 % — ABNORMAL LOW (ref 43.0–77.0)
Platelets: 420 10*3/uL — ABNORMAL HIGH (ref 150.0–400.0)
RBC: 5.32 Mil/uL (ref 4.22–5.81)
RDW: 12.6 % (ref 11.5–15.5)
WBC: 5.9 10*3/uL (ref 4.0–10.5)

## 2022-12-23 LAB — HEMOGLOBIN A1C: Hgb A1c MFr Bld: 6.6 % — ABNORMAL HIGH (ref 4.6–6.5)

## 2022-12-23 LAB — HEPATIC FUNCTION PANEL
ALT: 24 U/L (ref 0–53)
AST: 30 U/L (ref 0–37)
Albumin: 4.6 g/dL (ref 3.5–5.2)
Alkaline Phosphatase: 44 U/L (ref 39–117)
Bilirubin, Direct: 0.2 mg/dL (ref 0.0–0.3)
Total Bilirubin: 0.9 mg/dL (ref 0.2–1.2)
Total Protein: 7.1 g/dL (ref 6.0–8.3)

## 2022-12-23 LAB — TSH: TSH: 1.93 u[IU]/mL (ref 0.35–5.50)

## 2022-12-23 LAB — PSA: PSA: 0.78 ng/mL (ref 0.10–4.00)

## 2022-12-23 MED ORDER — ROSUVASTATIN CALCIUM 20 MG PO TABS: 20.0000 mg | ORAL_TABLET | Freq: Every day | ORAL | 3 refills | Status: DC

## 2022-12-23 MED ORDER — ALLOPURINOL 100 MG PO TABS: 100.0000 mg | ORAL_TABLET | Freq: Every day | ORAL | 3 refills | Status: DC

## 2022-12-23 MED ORDER — FENOFIBRATE 145 MG PO TABS: 145.0000 mg | ORAL_TABLET | Freq: Every day | ORAL | 3 refills | Status: DC

## 2022-12-23 MED ORDER — LOSARTAN POTASSIUM 100 MG PO TABS: 100.0000 mg | ORAL_TABLET | Freq: Every day | ORAL | 3 refills | Status: DC

## 2022-12-23 NOTE — Progress Notes (Signed)
Subjective:    Patient ID: Austin Hughes, male    DOB: Feb 02, 1966, 57 y.o.   MRN: [id number]  HPI Here for a well exam. He feels great. He asks [place] to check some skin tags he wants removed. He also mentions a mole on his scalp that has been present for years, but it does not bother him.    Review of Systems  Constitutional: Negative.   HENT: Negative.    Eyes: Negative.   Respiratory: Negative.    Cardiovascular: Negative.   Gastrointestinal: Negative.   Genitourinary: Negative.   Musculoskeletal: Negative.   Skin: Negative.   Neurological: Negative.   Psychiatric/Behavioral: Negative.         Objective:   Physical Exam Constitutional:      General: He is not in acute distress.    Appearance: Normal appearance. He is well-developed. He is not diaphoretic.  HENT:     Head: Normocephalic and atraumatic.     Right Ear: External ear normal.     Left Ear: External ear normal.     Nose: Nose normal.     Mouth/Throat:     Pharynx: No oropharyngeal exudate.  Eyes:     General: No scleral icterus.       Right eye: No discharge.        Left eye: No discharge.     Conjunctiva/sclera: Conjunctivae normal.     Pupils: Pupils are equal, round, and reactive to light.  Neck:     Thyroid: No thyromegaly.     Vascular: No JVD.     Trachea: No tracheal deviation.  Cardiovascular:     Rate and Rhythm: Normal rate and regular rhythm.     Pulses: Normal pulses.     Heart sounds: Normal heart sounds. No murmur heard.    No friction rub. No gallop.  Pulmonary:     Effort: Pulmonary effort is normal. No respiratory distress.     Breath sounds: Normal breath sounds. No wheezing or rales.  Chest:     Chest wall: No tenderness.  Abdominal:     General: Bowel sounds are normal. There is no distension.     Palpations: Abdomen is soft. There is no mass.     Tenderness: There is no abdominal tenderness. There is no guarding or rebound.  Genitourinary:    Penis: Normal. No tenderness.       Testes: Normal.     Prostate: Normal.     Rectum: Normal. Guaiac result negative.  Musculoskeletal:        General: No tenderness. Normal range of motion.     Cervical back: Neck supple.  Lymphadenopathy:     Cervical: No cervical adenopathy.  Skin:    General: Skin is warm and dry.     Coloration: Skin is not pale.     Findings: No erythema or rash.     Comments: There is a skin colored 6 mm nevus on the vertex of the scalp. There are also a few skin tags in the left axilla and the left flank   Neurological:     General: No focal deficit present.     Mental Status: He is alert and oriented to person, place, and time.     Cranial Nerves: No cranial nerve deficit.     Motor: No abnormal muscle tone.     Coordination: Coordination normal.     Deep Tendon Reflexes: Reflexes are normal and symmetric. Reflexes normal.  Psychiatric:  Mood and Affect: Mood normal.        Behavior: Behavior normal.        Thought Content: Thought content normal.        Judgment: Judgment normal.           Assessment & Plan:  Well exam. We discussed diet and exercise. Get fasting labs. The scall lesion is benign, so we agreed to simply monitor this. He will return soon to have the skin tags removed.  Gershon Crane, MD

## 2022-12-23 NOTE — Addendum Note (Signed)
Addended by: Carola Rhine on: 12/23/2022 09:04 AM   Modules accepted: Orders

## 2023-04-11 ENCOUNTER — Encounter: Payer: Self-pay | Admitting: Family Medicine

## 2023-04-11 NOTE — Telephone Encounter (Signed)
 Care team updated and letter sent for eye exam notes.

## 2023-11-20 DIAGNOSIS — Z713 Dietary counseling and surveillance: Secondary | ICD-10-CM | POA: Diagnosis not present

## 2023-12-25 ENCOUNTER — Encounter: Payer: Self-pay | Admitting: Family Medicine

## 2023-12-25 ENCOUNTER — Ambulatory Visit: Payer: Self-pay | Admitting: Family Medicine

## 2023-12-25 ENCOUNTER — Ambulatory Visit (INDEPENDENT_AMBULATORY_CARE_PROVIDER_SITE_OTHER): Payer: BC Managed Care – PPO | Admitting: Family Medicine

## 2023-12-25 VITALS — BP 110/70 | HR 73 | Temp 97.8°F | Ht 71.0 in | Wt 212.0 lb

## 2023-12-25 DIAGNOSIS — Z0000 Encounter for general adult medical examination without abnormal findings: Secondary | ICD-10-CM | POA: Diagnosis not present

## 2023-12-25 DIAGNOSIS — M1000 Idiopathic gout, unspecified site: Secondary | ICD-10-CM | POA: Diagnosis not present

## 2023-12-25 LAB — CBC WITH DIFFERENTIAL/PLATELET
Basophils Absolute: 0.1 K/uL (ref 0.0–0.1)
Basophils Relative: 0.7 % (ref 0.0–3.0)
Eosinophils Absolute: 0.3 K/uL (ref 0.0–0.7)
Eosinophils Relative: 4.6 % (ref 0.0–5.0)
HCT: 45.7 % (ref 39.0–52.0)
Hemoglobin: 15.6 g/dL (ref 13.0–17.0)
Lymphocytes Relative: 32.1 % (ref 12.0–46.0)
Lymphs Abs: 2.3 K/uL (ref 0.7–4.0)
MCHC: 34.2 g/dL (ref 30.0–36.0)
MCV: 91.7 fl (ref 78.0–100.0)
Monocytes Absolute: 0.4 K/uL (ref 0.1–1.0)
Monocytes Relative: 6.2 % (ref 3.0–12.0)
Neutro Abs: 4 K/uL (ref 1.4–7.7)
Neutrophils Relative %: 56.4 % (ref 43.0–77.0)
Platelets: 392 K/uL (ref 150.0–400.0)
RBC: 4.99 Mil/uL (ref 4.22–5.81)
RDW: 12.3 % (ref 11.5–15.5)
WBC: 7 K/uL (ref 4.0–10.5)

## 2023-12-25 LAB — HEMOGLOBIN A1C: Hgb A1c MFr Bld: 6.8 % — ABNORMAL HIGH (ref 4.6–6.5)

## 2023-12-25 LAB — TSH: TSH: 1.03 u[IU]/mL (ref 0.35–5.50)

## 2023-12-25 LAB — LIPID PANEL
Cholesterol: 138 mg/dL (ref 0–200)
HDL: 32.1 mg/dL — ABNORMAL LOW (ref >39.00)
LDL Cholesterol: 66 mg/dL (ref 0–99)
NonHDL: 106.11
Total CHOL/HDL Ratio: 4
Triglycerides: 199 mg/dL — ABNORMAL HIGH (ref 0.0–149.0)
VLDL: 39.8 mg/dL (ref 0.0–40.0)

## 2023-12-25 LAB — PSA: PSA: 0.86 ng/mL (ref 0.10–4.00)

## 2023-12-25 LAB — HEPATIC FUNCTION PANEL
ALT: 29 U/L (ref 0–53)
AST: 31 U/L (ref 0–37)
Albumin: 4.6 g/dL (ref 3.5–5.2)
Alkaline Phosphatase: 46 U/L (ref 39–117)
Bilirubin, Direct: 0.1 mg/dL (ref 0.0–0.3)
Total Bilirubin: 0.6 mg/dL (ref 0.2–1.2)
Total Protein: 6.9 g/dL (ref 6.0–8.3)

## 2023-12-25 LAB — BASIC METABOLIC PANEL WITH GFR
BUN: 24 mg/dL — ABNORMAL HIGH (ref 6–23)
CO2: 27 meq/L (ref 19–32)
Calcium: 9.9 mg/dL (ref 8.4–10.5)
Chloride: 101 meq/L (ref 96–112)
Creatinine, Ser: 1.3 mg/dL (ref 0.40–1.50)
GFR: 61.02 mL/min (ref >60.00)
Glucose, Bld: 135 mg/dL — ABNORMAL HIGH (ref 70–99)
Potassium: 4 meq/L (ref 3.5–5.1)
Sodium: 138 meq/L (ref 135–145)

## 2023-12-25 LAB — TESTOSTERONE: Testosterone: 311.61 ng/dL (ref 300.00–890.00)

## 2023-12-25 LAB — URIC ACID: Uric Acid, Serum: 6 mg/dL (ref 4.0–7.8)

## 2023-12-25 MED ORDER — ROSUVASTATIN CALCIUM 20 MG PO TABS: 20.0000 mg | ORAL_TABLET | Freq: Every day | ORAL | 3 refills | Status: AC

## 2023-12-25 MED ORDER — ALLOPURINOL 100 MG PO TABS: 100.0000 mg | ORAL_TABLET | Freq: Every day | ORAL | 3 refills | Status: AC

## 2023-12-25 MED ORDER — FENOFIBRATE 145 MG PO TABS: 145.0000 mg | ORAL_TABLET | Freq: Every day | ORAL | 3 refills | Status: AC

## 2023-12-25 MED ORDER — LOSARTAN POTASSIUM 100 MG PO TABS: 100.0000 mg | ORAL_TABLET | Freq: Every day | ORAL | 3 refills | Status: AC

## 2023-12-25 MED ORDER — TADALAFIL 20 MG PO TABS: 20.0000 mg | ORAL_TABLET | ORAL | 5 refills | Status: AC | PRN

## 2023-12-25 NOTE — Progress Notes (Signed)
 Subjective:    Patient ID: Austin Hughes, male    DOB: [birth year], 58 y.o.   MRN: [id number]  HPI Here for a well exam. He has a few issues. First he has had some trouble with getting and maintaining erections, and his libido has dropped a little. Also he says once on awhile when he gets out of bed or when he is bending over working in his yard, he will feel lightheaded for a few seconds when he stands up. His BP at home averages 120's over 80's.    Review of Systems  Constitutional: Negative.   HENT: Negative.    Eyes: Negative.   Respiratory: Negative.    Cardiovascular: Negative.   Gastrointestinal: Negative.   Genitourinary: Negative.   Musculoskeletal: Negative.   Skin: Negative.   Neurological:  Positive for light-headedness.  Psychiatric/Behavioral: Negative.         Objective:   Physical Exam Constitutional:      General: He is not in acute distress.    Appearance: Normal appearance. He is well-developed. He is not diaphoretic.  HENT:     Head: Normocephalic and atraumatic.     Right Ear: External ear normal.     Left Ear: External ear normal.     Nose: Nose normal.     Mouth/Throat:     Pharynx: No oropharyngeal exudate.  Eyes:     General: No scleral icterus.       Right eye: No discharge.        Left eye: No discharge.     Conjunctiva/sclera: Conjunctivae normal.     Pupils: Pupils are equal, round, and reactive to light.  Neck:     Thyroid : No thyromegaly.     Vascular: No JVD.     Trachea: No tracheal deviation.  Cardiovascular:     Rate and Rhythm: Normal rate and regular rhythm.     Pulses: Normal pulses.     Heart sounds: Normal heart sounds. No murmur heard.    No friction rub. No gallop.  Pulmonary:     Effort: Pulmonary effort is normal. No respiratory distress.     Breath sounds: Normal breath sounds. No wheezing or rales.  Chest:     Chest wall: No tenderness.  Abdominal:     General: Bowel sounds are normal. There is no distension.      Palpations: Abdomen is soft. There is no mass.     Tenderness: There is no abdominal tenderness. There is no guarding or rebound.  Genitourinary:    Penis: Normal. No tenderness.      Testes: Normal.     Prostate: Normal.     Rectum: Normal. Guaiac result negative.  Musculoskeletal:        General: No tenderness. Normal range of motion.     Cervical back: Neck supple.  Lymphadenopathy:     Cervical: No cervical adenopathy.  Skin:    General: Skin is warm and dry.     Coloration: Skin is not pale.     Findings: No erythema or rash.  Neurological:     General: No focal deficit present.     Mental Status: He is alert and oriented to person, place, and time.     Cranial Nerves: No cranial nerve deficit.     Motor: No abnormal muscle tone.     Coordination: Coordination normal.     Deep Tendon Reflexes: Reflexes are normal and symmetric. Reflexes normal.  Psychiatric:  Mood and Affect: Mood normal.        Behavior: Behavior normal.        Thought Content: Thought content normal.        Judgment: Judgment normal.           Assessment & Plan:  Well exam. We discussed diet and exercise. Get fasting labs. He is having orthostatic episodes so I reminded him to stay hydrated. We will check a testosterone  level for the ED, and he can try some Cialis  20 mg as needed.  Garnette Olmsted, MD

## 2023-12-27 ENCOUNTER — Ambulatory Visit: Admitting: Family Medicine

## 2023-12-27 VITALS — BP 134/80 | HR 74 | Temp 98.2°F | Wt 213.0 lb

## 2023-12-27 DIAGNOSIS — L918 Other hypertrophic disorders of the skin: Secondary | ICD-10-CM

## 2023-12-27 NOTE — Progress Notes (Signed)
   Subjective:    Patient ID: Austin Hughes, male    DOB: 1965-10-07, 58 y.o.   MRN: [id number]  HPI Here for removal of skin tags. He has one in each axilla and one on the left flank. These get sore from rubbing on clothing.    Review of Systems  Constitutional: Negative.        Objective:   Physical Exam Constitutional:      Appearance: Normal appearance.  Skin:    Comments: 3 small pedunculated skin tags, one in each axilla and one on the left flank   Neurological:     Mental Status: He is alert.           Assessment & Plan:  Skin tags. After informed consent was obtained, all thre of these were excised using scissors. The wounds were dressed with Bandaids. He tolerated these procedures well.  Garnette Olmsted, MD

## 2023-12-28 ENCOUNTER — Other Ambulatory Visit: Payer: Self-pay

## 2023-12-28 MED ORDER — METFORMIN HCL 500 MG PO TABS: 500.0000 mg | ORAL_TABLET | Freq: Two times a day (BID) | ORAL | 3 refills | Status: AC

## 2024-01-22 DIAGNOSIS — M25562 Pain in left knee: Secondary | ICD-10-CM | POA: Diagnosis not present

## 2024-01-22 DIAGNOSIS — M25462 Effusion, left knee: Secondary | ICD-10-CM | POA: Diagnosis not present

## 2024-02-21 DIAGNOSIS — M25462 Effusion, left knee: Secondary | ICD-10-CM | POA: Diagnosis not present

## 2024-12-26 ENCOUNTER — Encounter: Admitting: Family Medicine
